# Patient Record
Sex: Male | Born: 1985 | Race: Black or African American | Hispanic: No | Marital: Single | Smoking: Current every day smoker
Health system: Southern US, Community
[De-identification: ages and names within clinical notes are randomized; demographics above are authoritative.]

## PROBLEM LIST (undated history)

## (undated) DIAGNOSIS — J45909 Unspecified asthma, uncomplicated: Secondary | ICD-10-CM

---
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
## 2007-10-05 ENCOUNTER — Emergency Department (HOSPITAL_COMMUNITY): Admission: EM | Admit: 2007-10-05 | Discharge: 2007-10-05 | Payer: Self-pay | Admitting: Emergency Medicine

## 2008-11-16 ENCOUNTER — Emergency Department (HOSPITAL_COMMUNITY): Admission: EM | Admit: 2008-11-16 | Discharge: 2008-11-16 | Payer: Self-pay | Admitting: Emergency Medicine

## 2009-04-09 ENCOUNTER — Emergency Department (HOSPITAL_COMMUNITY): Admission: EM | Admit: 2009-04-09 | Discharge: 2009-04-09 | Payer: Self-pay | Admitting: Emergency Medicine

## 2009-08-09 ENCOUNTER — Emergency Department (HOSPITAL_COMMUNITY): Admission: EM | Admit: 2009-08-09 | Discharge: 2009-08-09 | Payer: Self-pay | Admitting: Family Medicine

## 2010-04-04 LAB — POCT URINALYSIS DIP (DEVICE)
Glucose, UA: NEGATIVE mg/dL
Ketones, ur: NEGATIVE mg/dL

## 2010-04-04 LAB — GC/CHLAMYDIA PROBE AMP, GENITAL
Chlamydia, DNA Probe: NEGATIVE
GC Probe Amp, Genital: POSITIVE — AB

## 2010-04-23 LAB — URINALYSIS, ROUTINE W REFLEX MICROSCOPIC
Protein, ur: NEGATIVE mg/dL
Urobilinogen, UA: 0.2 mg/dL (ref 0.0–1.0)

## 2010-04-23 LAB — GC/CHLAMYDIA PROBE AMP, GENITAL
Chlamydia, DNA Probe: NEGATIVE
GC Probe Amp, Genital: POSITIVE — AB

## 2010-04-23 LAB — URINE MICROSCOPIC-ADD ON

## 2010-09-23 ENCOUNTER — Emergency Department (HOSPITAL_COMMUNITY)
Admission: EM | Admit: 2010-09-23 | Discharge: 2010-09-23 | Disposition: A | Discharge status: Home / Self Care | Payer: Self-pay | Attending: Emergency Medicine | Admitting: Emergency Medicine

## 2010-09-23 ENCOUNTER — Emergency Department (HOSPITAL_COMMUNITY): Payer: Self-pay

## 2010-09-23 DIAGNOSIS — S62329A Displaced fracture of shaft of unspecified metacarpal bone, initial encounter for closed fracture: Secondary | ICD-10-CM | POA: Insufficient documentation

## 2010-09-23 DIAGNOSIS — M7989 Other specified soft tissue disorders: Secondary | ICD-10-CM | POA: Insufficient documentation

## 2010-09-23 DIAGNOSIS — S6990XA Unspecified injury of unspecified wrist, hand and finger(s), initial encounter: Secondary | ICD-10-CM | POA: Insufficient documentation

## 2010-09-23 DIAGNOSIS — M79609 Pain in unspecified limb: Secondary | ICD-10-CM | POA: Insufficient documentation

## 2010-09-23 DIAGNOSIS — W2209XA Striking against other stationary object, initial encounter: Secondary | ICD-10-CM | POA: Insufficient documentation

## 2010-10-19 LAB — GC/CHLAMYDIA PROBE AMP, GENITAL: GC Probe Amp, Genital: POSITIVE — AB

## 2013-11-16 ENCOUNTER — Emergency Department (HOSPITAL_COMMUNITY)
Admission: EM | Admit: 2013-11-16 | Discharge: 2013-11-16 | Disposition: A | Discharge status: Home / Self Care | Payer: Self-pay | Attending: Emergency Medicine | Admitting: Emergency Medicine

## 2013-11-16 ENCOUNTER — Encounter (HOSPITAL_COMMUNITY): Payer: Self-pay | Admitting: Emergency Medicine

## 2013-11-16 DIAGNOSIS — Z720 Tobacco use: Secondary | ICD-10-CM | POA: Insufficient documentation

## 2013-11-16 DIAGNOSIS — R369 Urethral discharge, unspecified: Secondary | ICD-10-CM | POA: Insufficient documentation

## 2013-11-16 LAB — URINALYSIS, ROUTINE W REFLEX MICROSCOPIC
BILIRUBIN URINE: NEGATIVE
Glucose, UA: NEGATIVE mg/dL
Hgb urine dipstick: NEGATIVE
KETONES UR: 15 mg/dL — AB
NITRITE: NEGATIVE
PH: 6 (ref 5.0–8.0)
PROTEIN: 30 mg/dL — AB
Specific Gravity, Urine: 1.026 (ref 1.005–1.030)
Urobilinogen, UA: 1 mg/dL (ref 0.0–1.0)

## 2013-11-16 LAB — URINE MICROSCOPIC-ADD ON

## 2013-11-16 MED ORDER — LIDOCAINE HCL (PF) 1 % IJ SOLN
2.0000 mL | Freq: Once | INTRAMUSCULAR | Status: DC
  Filled 2013-11-16: qty 5

## 2013-11-16 MED ORDER — AZITHROMYCIN 250 MG PO TABS
1000.0000 mg | ORAL_TABLET | Freq: Once | ORAL | Status: AC
  Administered 2013-11-16: 1000 mg via ORAL
  Filled 2013-11-16: qty 4

## 2013-11-16 MED ORDER — CEFTRIAXONE SODIUM 250 MG IJ SOLR
250.0000 mg | Freq: Once | INTRAMUSCULAR | Status: AC
  Administered 2013-11-16: 250 mg via INTRAMUSCULAR
  Filled 2013-11-16: qty 250

## 2013-11-16 NOTE — ED Provider Notes (Signed)
Medical screening examination/treatment/procedure(s) were performed by non-physician practitioner and as supervising physician I was immediately available for consultation/collaboration.  Viktorya Arguijo L Dazja Houchin, MD 11/16/13 1543 

## 2013-11-16 NOTE — Discharge Instructions (Signed)

## 2013-11-16 NOTE — ED Notes (Signed)
Pt reports he has had "irritation" in penis x 2-3 days. Reports this am, he had cloudy drainage from the meatus.

## 2013-11-16 NOTE — ED Provider Notes (Signed)
CSN: [id number]     Arrival date & time 11/16/13  1131 History  This chart was scribed for non-physician practitioner, Ivar Drapeob Pearla Mckinny, PA-C,working with Flint MelterElliott L Wentz, MD, by Karle PlumberJennifer Tensley, ED Scribe. This patient was seen in room TR05C/TR05C and the patient's care was started at 11:50 AM.  No chief complaint on file.  The history is provided by the patient. No language interpreter was used.   HPI Comments:  Robert Dillon is a 28 y.o. male who presents to the Emergency Department complaining of irritation of the tip of his penis for the past 2-3 days. He reports associated cloudy discharge starting this morning. He reports he has been checked for STDs in the past. He denies dysuria, difficulty urinating, fever, chills, nausea or vomiting.  History reviewed. No pertinent past medical history. History reviewed. No pertinent past surgical history. No family history on file. History  Substance Use Topics  . Smoking status: Current Every Day Smoker  . Smokeless tobacco: Not on file  . Alcohol Use: Yes    Review of Systems  Constitutional: Negative for fever and chills.  Gastrointestinal: Negative for nausea and vomiting.  Genitourinary: Positive for penile swelling. Negative for dysuria and difficulty urinating.  Skin: Negative for color change.    Allergies  Review of patient's allergies indicates no known allergies.  Home Medications   Prior to Admission medications   Not on File   Triage Vitals: BP 137/81  Pulse 81  Temp(Src) 98.2 F (36.8 C) (Oral)  Resp 18  SpO2 99% Physical Exam  Nursing note and vitals reviewed. Constitutional: He is oriented to person, place, and time. He appears well-developed and well-nourished.  HENT:  Head: Normocephalic and atraumatic.  Eyes: EOM are normal.  Neck: Normal range of motion.  Cardiovascular: Normal rate.   Pulmonary/Chest: Effort normal.  Genitourinary: Penis normal.  Normal circumcised male. Mild, milky discharge. No  masses, lesions or other abnormalities of penis, testes, or scrotum.  Musculoskeletal: Normal range of motion.  Neurological: He is alert and oriented to person, place, and time.  Skin: Skin is warm and dry.  Psychiatric: He has a normal mood and affect. His behavior is normal.    ED Course  Procedures (including critical care time) DIAGNOSTIC STUDIES: Oxygen Saturation is 99% on RA, normal by my interpretation.   COORDINATION OF CARE: 11:54 AM- Will check and treat for GC/chlamydia. Pt verbalizes understanding and agrees to plan.  Medications  azithromycin (ZITHROMAX) tablet 1,000 mg (1,000 mg Oral Given 11/16/13 1209)  cefTRIAXone (ROCEPHIN) injection 250 mg (250 mg Intramuscular Given 11/16/13 1211)    Labs Review Labs Reviewed  URINALYSIS, ROUTINE W REFLEX MICROSCOPIC - Abnormal; Notable for the following:    APPearance CLOUDY (*)    Ketones, ur 15 (*)    Protein, ur 30 (*)    Leukocytes, UA SMALL (*)    All other components within normal limits  GC/CHLAMYDIA PROBE AMP  URINE MICROSCOPIC-ADD ON    Imaging Review No results found.   EKG Interpretation None      MDM   Final diagnoses:  Penile discharge    Patient with penile discharge.  Will check urine GC and UA.  Will treat with ceftriaxone and azithromycin.  I personally performed the services described in this documentation, which was scribed in my presence. The recorded information has been reviewed and is accurate.    Roxy Horsemanobert Josalin Carneiro, PA-C 11/16/13 1249

## 2013-11-17 LAB — GC/CHLAMYDIA PROBE AMP
CT Probe RNA: POSITIVE — AB
GC Probe RNA: POSITIVE — AB

## 2013-11-18 ENCOUNTER — Telehealth (HOSPITAL_COMMUNITY): Payer: Self-pay

## 2013-11-18 NOTE — Telephone Encounter (Signed)
spoke with pt. verified ID. informed of lab results. treated per potocol. advised to abstain from sexual activity x 10 days and to notify partner(s)

## 2013-12-16 ENCOUNTER — Ambulatory Visit (HOSPITAL_COMMUNITY)
Admission: EM | Admit: 2013-12-16 | Discharge: 2013-12-16 | Disposition: A | Discharge status: Home / Self Care | Payer: Self-pay | Attending: Emergency Medicine | Admitting: Emergency Medicine

## 2013-12-16 ENCOUNTER — Emergency Department (HOSPITAL_COMMUNITY): Payer: Self-pay | Admitting: Anesthesiology

## 2013-12-16 ENCOUNTER — Emergency Department (HOSPITAL_COMMUNITY): Payer: Self-pay

## 2013-12-16 ENCOUNTER — Encounter (HOSPITAL_COMMUNITY)
Admission: EM | Disposition: A | Discharge status: Home / Self Care | Payer: Self-pay | Source: Home / Self Care | Attending: Emergency Medicine

## 2013-12-16 ENCOUNTER — Encounter (HOSPITAL_COMMUNITY): Payer: Self-pay | Admitting: Emergency Medicine

## 2013-12-16 DIAGNOSIS — S81811A Laceration without foreign body, right lower leg, initial encounter: Secondary | ICD-10-CM | POA: Insufficient documentation

## 2013-12-16 DIAGNOSIS — Y9389 Activity, other specified: Secondary | ICD-10-CM | POA: Insufficient documentation

## 2013-12-16 DIAGNOSIS — F17210 Nicotine dependence, cigarettes, uncomplicated: Secondary | ICD-10-CM | POA: Insufficient documentation

## 2013-12-16 DIAGNOSIS — T1490XA Injury, unspecified, initial encounter: Secondary | ICD-10-CM

## 2013-12-16 DIAGNOSIS — Y92038 Other place in apartment as the place of occurrence of the external cause: Secondary | ICD-10-CM | POA: Insufficient documentation

## 2013-12-16 DIAGNOSIS — J45909 Unspecified asthma, uncomplicated: Secondary | ICD-10-CM | POA: Insufficient documentation

## 2013-12-16 DIAGNOSIS — W19XXXA Unspecified fall, initial encounter: Secondary | ICD-10-CM | POA: Insufficient documentation

## 2013-12-16 HISTORY — PX: INCISION AND DRAINAGE: SHX5863

## 2013-12-16 HISTORY — DX: Unspecified asthma, uncomplicated: J45.909

## 2013-12-16 LAB — CBC WITH DIFFERENTIAL/PLATELET
Basophils Absolute: 0 10*3/uL (ref 0.0–0.1)
Basophils Relative: 0 % (ref 0–1)
Eosinophils Absolute: 0 10*3/uL (ref 0.0–0.7)
Eosinophils Relative: 0 % (ref 0–5)
HCT: 45.5 % (ref 39.0–52.0)
HEMOGLOBIN: 15.3 g/dL (ref 13.0–17.0)
LYMPHS ABS: 3.3 10*3/uL (ref 0.7–4.0)
LYMPHS PCT: 41 % (ref 12–46)
MCH: 31.2 pg (ref 26.0–34.0)
MCHC: 33.6 g/dL (ref 30.0–36.0)
MCV: 92.7 fL (ref 78.0–100.0)
Monocytes Absolute: 0.7 10*3/uL (ref 0.1–1.0)
Monocytes Relative: 8 % (ref 3–12)
NEUTROS PCT: 51 % (ref 43–77)
Neutro Abs: 4.1 10*3/uL (ref 1.7–7.7)
PLATELETS: 323 10*3/uL (ref 150–400)
RBC: 4.91 MIL/uL (ref 4.22–5.81)
RDW: 13.7 % (ref 11.5–15.5)
WBC: 8.1 10*3/uL (ref 4.0–10.5)

## 2013-12-16 LAB — BASIC METABOLIC PANEL
Anion gap: 18 — ABNORMAL HIGH (ref 5–15)
BUN: 8 mg/dL (ref 6–23)
CO2: 24 meq/L (ref 19–32)
Calcium: 9.2 mg/dL (ref 8.4–10.5)
Chloride: 101 mEq/L (ref 96–112)
Creatinine, Ser: 0.88 mg/dL (ref 0.50–1.35)
GFR calc Af Amer: >90 mL/min (ref >90)
GFR calc non Af Amer: >90 mL/min (ref >90)
GLUCOSE: 93 mg/dL (ref 70–99)
POTASSIUM: 3.8 meq/L (ref 3.7–5.3)
SODIUM: 143 meq/L (ref 137–147)

## 2013-12-16 LAB — ETHANOL: ALCOHOL ETHYL (B): 211 mg/dL — AB (ref 0–11)

## 2013-12-16 SURGERY — INCISION AND DRAINAGE
Anesthesia: General | Laterality: Right

## 2013-12-16 MED ORDER — LIDOCAINE HCL (CARDIAC) 20 MG/ML IV SOLN
INTRAVENOUS | Status: DC | PRN
  Administered 2013-12-16: 90 mg via INTRAVENOUS

## 2013-12-16 MED ORDER — HYDROMORPHONE HCL 1 MG/ML IJ SOLN
0.2500 mg | INTRAMUSCULAR | Status: DC | PRN
  Administered 2013-12-16 (×4): 0.5 mg via INTRAVENOUS

## 2013-12-16 MED ORDER — OXYCODONE HCL 5 MG/5ML PO SOLN
5.0000 mg | Freq: Once | ORAL | Status: AC | PRN
Start: 2013-12-16 — End: 2013-12-16

## 2013-12-16 MED ORDER — SUCCINYLCHOLINE CHLORIDE 20 MG/ML IJ SOLN
INTRAMUSCULAR | Status: AC
  Filled 2013-12-16: qty 1

## 2013-12-16 MED ORDER — MORPHINE SULFATE 4 MG/ML IJ SOLN: 4.0000 mg | Freq: Once | INTRAMUSCULAR | Status: DC

## 2013-12-16 MED ORDER — ONDANSETRON HCL 4 MG/2ML IJ SOLN
INTRAMUSCULAR | Status: DC | PRN
  Administered 2013-12-16: 4 mg via INTRAVENOUS

## 2013-12-16 MED ORDER — IPRATROPIUM-ALBUTEROL 20-100 MCG/ACT IN AERS
INHALATION_SPRAY | RESPIRATORY_TRACT | Status: DC | PRN
  Administered 2013-12-16: 3 via RESPIRATORY_TRACT

## 2013-12-16 MED ORDER — GLYCOPYRROLATE 0.2 MG/ML IJ SOLN
INTRAMUSCULAR | Status: AC
  Filled 2013-12-16: qty 3

## 2013-12-16 MED ORDER — MORPHINE SULFATE 2 MG/ML IJ SOLN
INTRAMUSCULAR | Status: AC
  Administered 2013-12-16: 4 mg via INTRAVENOUS
  Filled 2013-12-16: qty 2

## 2013-12-16 MED ORDER — SODIUM CHLORIDE 0.9 % IR SOLN
Status: DC | PRN
  Administered 2013-12-16: 3000 mL

## 2013-12-16 MED ORDER — FENTANYL CITRATE 0.05 MG/ML IJ SOLN
INTRAMUSCULAR | Status: DC | PRN
  Administered 2013-12-16 (×2): 50 ug via INTRAVENOUS
  Administered 2013-12-16: 100 ug via INTRAVENOUS
  Administered 2013-12-16: 50 ug via INTRAVENOUS

## 2013-12-16 MED ORDER — OXYCODONE HCL 5 MG PO TABS
ORAL_TABLET | ORAL | Status: AC
  Administered 2013-12-16: 5 mg via ORAL
  Filled 2013-12-16: qty 1

## 2013-12-16 MED ORDER — HYDROCODONE-ACETAMINOPHEN 5-325 MG PO TABS
1.0000 | ORAL_TABLET | Freq: Once | ORAL | Status: AC
  Administered 2013-12-16: 2 via ORAL

## 2013-12-16 MED ORDER — FENTANYL CITRATE 0.05 MG/ML IJ SOLN
INTRAMUSCULAR | Status: AC
  Filled 2013-12-16: qty 5

## 2013-12-16 MED ORDER — PHENYLEPHRINE HCL 10 MG/ML IJ SOLN
INTRAMUSCULAR | Status: DC | PRN
  Administered 2013-12-16 (×4): 40 ug via INTRAVENOUS

## 2013-12-16 MED ORDER — BUPIVACAINE-EPINEPHRINE (PF) 0.5% -1:200000 IJ SOLN
INTRAMUSCULAR | Status: AC
  Filled 2013-12-16: qty 30

## 2013-12-16 MED ORDER — NEOSTIGMINE METHYLSULFATE 10 MG/10ML IV SOLN
INTRAVENOUS | Status: AC
  Filled 2013-12-16: qty 1

## 2013-12-16 MED ORDER — TETANUS-DIPHTH-ACELL PERTUSSIS 5-2.5-18.5 LF-MCG/0.5 IM SUSP
0.5000 mL | Freq: Once | INTRAMUSCULAR | Status: AC
  Administered 2013-12-16: 0.5 mL via INTRAMUSCULAR

## 2013-12-16 MED ORDER — OXYCODONE HCL 5 MG PO TABS
5.0000 mg | ORAL_TABLET | Freq: Once | ORAL | Status: AC | PRN
  Administered 2013-12-16: 5 mg via ORAL

## 2013-12-16 MED ORDER — SUCCINYLCHOLINE CHLORIDE 20 MG/ML IJ SOLN
INTRAMUSCULAR | Status: DC | PRN
  Administered 2013-12-16: 100 mg via INTRAVENOUS

## 2013-12-16 MED ORDER — ROCURONIUM BROMIDE 50 MG/5ML IV SOLN
INTRAVENOUS | Status: AC
  Filled 2013-12-16: qty 1

## 2013-12-16 MED ORDER — PROPOFOL 10 MG/ML IV BOLUS
INTRAVENOUS | Status: AC
Start: 2013-12-16 — End: 2013-12-16
  Filled 2013-12-16: qty 20

## 2013-12-16 MED ORDER — ARTIFICIAL TEARS OP OINT
TOPICAL_OINTMENT | OPHTHALMIC | Status: DC | PRN
Start: 2013-12-16 — End: 2013-12-16
  Administered 2013-12-16: 1 via OPHTHALMIC

## 2013-12-16 MED ORDER — CEPHALEXIN 500 MG PO CAPS: 500.0000 mg | ORAL_CAPSULE | Freq: Four times a day (QID) | ORAL | Status: DC

## 2013-12-16 MED ORDER — MIDAZOLAM HCL 5 MG/5ML IJ SOLN
INTRAMUSCULAR | Status: DC | PRN
  Administered 2013-12-16: 2 mg via INTRAVENOUS

## 2013-12-16 MED ORDER — LIDOCAINE HCL (CARDIAC) 20 MG/ML IV SOLN
INTRAVENOUS | Status: AC
  Filled 2013-12-16: qty 5

## 2013-12-16 MED ORDER — DEXAMETHASONE SODIUM PHOSPHATE 4 MG/ML IJ SOLN
INTRAMUSCULAR | Status: DC | PRN
  Administered 2013-12-16: 8 mg via INTRAVENOUS

## 2013-12-16 MED ORDER — MIDAZOLAM HCL 2 MG/2ML IJ SOLN
INTRAMUSCULAR | Status: AC
Start: 2013-12-16 — End: 2013-12-16
  Filled 2013-12-16: qty 2

## 2013-12-16 MED ORDER — HYDROMORPHONE HCL 1 MG/ML IJ SOLN
INTRAMUSCULAR | Status: AC
  Administered 2013-12-16: 0.5 mg via INTRAVENOUS
  Filled 2013-12-16: qty 1

## 2013-12-16 MED ORDER — CEFAZOLIN SODIUM 1-5 GM-% IV SOLN
INTRAVENOUS | Status: DC | PRN
  Administered 2013-12-16: 1 g via INTRAVENOUS

## 2013-12-16 MED ORDER — PROPOFOL 10 MG/ML IV BOLUS
INTRAVENOUS | Status: DC | PRN
  Administered 2013-12-16: 150 mg via INTRAVENOUS

## 2013-12-16 MED ORDER — CEFAZOLIN SODIUM 1-5 GM-% IV SOLN
1.0000 g | Freq: Once | INTRAVENOUS | Status: AC
  Administered 2013-12-16: 1 g via INTRAVENOUS
  Filled 2013-12-16: qty 50

## 2013-12-16 MED ORDER — PROMETHAZINE HCL 25 MG/ML IJ SOLN: 6.2500 mg | INTRAMUSCULAR | Status: DC | PRN

## 2013-12-16 MED ORDER — HYDROCODONE-ACETAMINOPHEN 5-325 MG PO TABS: 1.0000 | ORAL_TABLET | Freq: Four times a day (QID) | ORAL | Status: DC | PRN

## 2013-12-16 MED ORDER — LACTATED RINGERS IV SOLN
INTRAVENOUS | Status: DC | PRN
  Administered 2013-12-16 (×3): via INTRAVENOUS

## 2013-12-16 MED ORDER — HYDROCODONE-ACETAMINOPHEN 5-325 MG PO TABS
ORAL_TABLET | ORAL | Status: AC
  Administered 2013-12-16: 2 via ORAL
  Filled 2013-12-16: qty 2

## 2013-12-16 MED ORDER — BUPIVACAINE HCL (PF) 0.5 % IJ SOLN
INTRAMUSCULAR | Status: DC | PRN
  Administered 2013-12-16: 20 mL

## 2013-12-16 MED ORDER — ONDANSETRON HCL 4 MG/2ML IJ SOLN
INTRAMUSCULAR | Status: AC
  Filled 2013-12-16: qty 2

## 2013-12-16 MED ORDER — TETANUS-DIPHTH-ACELL PERTUSSIS 5-2.5-18.5 LF-MCG/0.5 IM SUSP
INTRAMUSCULAR | Status: AC
  Filled 2013-12-16: qty 0.5

## 2013-12-16 MED ORDER — NICOTINE 21 MG/24HR TD PT24
21.0000 mg | MEDICATED_PATCH | Freq: Once | TRANSDERMAL | Status: DC
  Administered 2013-12-16: 21 mg via TRANSDERMAL
  Filled 2013-12-16: qty 1

## 2013-12-16 MED ORDER — MORPHINE SULFATE 4 MG/ML IJ SOLN
4.0000 mg | Freq: Once | INTRAMUSCULAR | Status: AC
  Administered 2013-12-16: 4 mg via INTRAVENOUS
  Filled 2013-12-16: qty 1

## 2013-12-16 MED ORDER — PROPOFOL 10 MG/ML IV BOLUS
INTRAVENOUS | Status: AC
  Filled 2013-12-16: qty 20

## 2013-12-16 SURGICAL SUPPLY — 56 items
BANDAGE ELASTIC 4 VELCRO ST LF (GAUZE/BANDAGES/DRESSINGS) ×3 IMPLANT
BANDAGE ELASTIC 6 VELCRO ST LF (GAUZE/BANDAGES/DRESSINGS) ×3 IMPLANT
BANDAGE ESMARK 6X9 LF (GAUZE/BANDAGES/DRESSINGS) IMPLANT
BLADE SURG 10 STRL SS (BLADE) IMPLANT
BNDG COHESIVE 4X5 TAN STRL (GAUZE/BANDAGES/DRESSINGS) IMPLANT
BNDG COHESIVE 6X5 TAN STRL LF (GAUZE/BANDAGES/DRESSINGS) IMPLANT
BNDG CONFORM 3 STRL LF (GAUZE/BANDAGES/DRESSINGS) IMPLANT
BNDG ESMARK 6X9 LF (GAUZE/BANDAGES/DRESSINGS)
CANISTER SUCT 3000ML (MISCELLANEOUS) IMPLANT
CHLORAPREP W/TINT 26ML (MISCELLANEOUS) IMPLANT
COVER SURGICAL LIGHT HANDLE (MISCELLANEOUS) ×3 IMPLANT
CUFF TOURNIQUET SINGLE 34IN LL (TOURNIQUET CUFF) IMPLANT
CUFF TOURNIQUET SINGLE 44IN (TOURNIQUET CUFF) IMPLANT
DRAIN CHANNEL 10M FLAT 3/4 FLT (DRAIN) IMPLANT
DRAIN PENROSE 1/2X12 LTX STRL (WOUND CARE) IMPLANT
DRAPE U-SHAPE 47X51 STRL (DRAPES) ×3 IMPLANT
DRSG ADAPTIC 3X8 NADH LF (GAUZE/BANDAGES/DRESSINGS) IMPLANT
DRSG MEPITEL 4X7.2 (GAUZE/BANDAGES/DRESSINGS) ×3 IMPLANT
DRSG PAD ABDOMINAL 8X10 ST (GAUZE/BANDAGES/DRESSINGS) ×3 IMPLANT
ELECT REM PT RETURN 9FT ADLT (ELECTROSURGICAL) ×3
ELECTRODE REM PT RTRN 9FT ADLT (ELECTROSURGICAL) ×1 IMPLANT
EVACUATOR SILICONE 100CC (DRAIN) IMPLANT
FLUID NSS /IRRIG 3000 ML XXX (IV SOLUTION) ×3 IMPLANT
GAUZE SPONGE 4X4 12PLY STRL (GAUZE/BANDAGES/DRESSINGS) ×3 IMPLANT
GAUZE SPONGE 4X4 16PLY XRAY LF (GAUZE/BANDAGES/DRESSINGS) ×3 IMPLANT
GLOVE BIO SURGEON STRL SZ7 (GLOVE) ×3 IMPLANT
GLOVE BIO SURGEON STRL SZ8 (GLOVE) ×3 IMPLANT
GLOVE BIOGEL PI IND STRL 8 (GLOVE) ×2 IMPLANT
GLOVE BIOGEL PI INDICATOR 8 (GLOVE) ×4
GOWN STRL REUS W/ TWL XL LVL3 (GOWN DISPOSABLE) ×1 IMPLANT
GOWN STRL REUS W/TWL XL LVL3 (GOWN DISPOSABLE) ×2
KIT BASIN OR (CUSTOM PROCEDURE TRAY) ×3 IMPLANT
KIT ROOM TURNOVER OR (KITS) ×3 IMPLANT
NS IRRIG 1000ML POUR BTL (IV SOLUTION) ×3 IMPLANT
PACK ORTHO EXTREMITY (CUSTOM PROCEDURE TRAY) ×3 IMPLANT
PAD ARMBOARD 7.5X6 YLW CONV (MISCELLANEOUS) ×3 IMPLANT
PAD CAST 4YDX4 CTTN HI CHSV (CAST SUPPLIES) ×1 IMPLANT
PADDING CAST COTTON 4X4 STRL (CAST SUPPLIES) ×2
SET CYSTO W/LG BORE CLAMP LF (SET/KITS/TRAYS/PACK) IMPLANT
SOAP 2 % CHG 4 OZ (WOUND CARE) IMPLANT
SPONGE LAP 4X18 X RAY DECT (DISPOSABLE) ×3 IMPLANT
STAPLER VISISTAT 35W (STAPLE) IMPLANT
SUCTION FRAZIER TIP 10 FR DISP (SUCTIONS) IMPLANT
SUT ETHILON 3 0 FSL (SUTURE) IMPLANT
SUT PROLENE 3 0 PS 2 (SUTURE) IMPLANT
SUT VIC AB 2-0 CT1 27 (SUTURE)
SUT VIC AB 2-0 CT1 TAPERPNT 27 (SUTURE) IMPLANT
SUT VIC AB 3-0 PS2 18 (SUTURE)
SUT VIC AB 3-0 PS2 18XBRD (SUTURE) IMPLANT
TOWEL OR 17X24 6PK STRL BLUE (TOWEL DISPOSABLE) ×3 IMPLANT
TOWEL OR 17X26 10 PK STRL BLUE (TOWEL DISPOSABLE) ×3 IMPLANT
TUBE CONNECTING 12'X1/4 (SUCTIONS) ×1
TUBE CONNECTING 12X1/4 (SUCTIONS) ×2 IMPLANT
TUBING CYSTO DISP (UROLOGICAL SUPPLIES) ×3 IMPLANT
WATER STERILE IRR 1000ML POUR (IV SOLUTION) IMPLANT
YANKAUER SUCT BULB TIP NO VENT (SUCTIONS) ×3 IMPLANT

## 2013-12-16 NOTE — Progress Notes (Signed)
Pt's girlfriend successfully contacted and directed to the hospital. Discharge instructions covered & all questions answered.

## 2013-12-16 NOTE — Op Note (Signed)
NAMMarius Dillon:  Treese, [place]veer            ACCOUNT NO.:  [id number]  MEDICAL RECORD NO.:  [id number]  LOCATION:  [facility]                         FACILITY:  [facility]  PHYSICIAN:  Toni ArthursJohn Elonna Mcfarlane, MD        DATE OF BIRTH:  [birth year]  DATE OF PROCEDURE:  12/16/2013 DATE OF DISCHARGE:                              OPERATIVE REPORT   POSTOPERATIVE DIAGNOSIS:  Right anteromedial leg laceration, 20 cm in length.  POSTOPERATIVE DIAGNOSIS:  Right anteromedial leg laceration, 20 cm in length including skin, subcutaneous tissue, and bone.  PROCEDURE: 1. Irrigation and excisional debridement of right leg laceration     measuring 20 cm in length including skin, subcutaneous tissue, and     bone. 2. Complex closure of right leg laceration, 20 cm in length.  SURGEON:  Toni ArthursJohn Shai Rasmussen, M.D.  ANESTHESIA:  General.  ESTIMATED BLOOD LOSS:  Minimal.  TOURNIQUET TIME:  Zero.  COMPLICATIONS:  None apparent.  DISPOSITION:  Extubated, awake, and stable to recovery.  INDICATIONS FOR PROCEDURE:  The patient is a 28 year old male with past medical history significant for smoking.  He lacerated his leg early this morning when trying to climb up the outside of his apartment building.  He denies any history of injury or surgery in this leg in the past.  He denies numbness, tingling, or weakness.  He understands the risks and benefits, the alternative treatment options, and elects surgical treatment.  He specifically understands risks of bleeding, infection, nerve damage, blood clots, need for additional surgery, continued pain, amputation, and death.  PROCEDURE IN DETAIL:  After preoperative consent was obtained and the correct operative site was identified, the patient was brought to the operating room and placed supine on the operating table.  General anesthesia was induced.  Preoperative antibiotics were administered. Surgical time-out was taken.  Right lower extremity was prepped and draped in standard sterile  fashion with a tourniquet around the thigh. The extremity was carefully examined.  The laceration measured 20 cm in length and was slightly undermined at the proximal end.  A scalpel was then used to remove the devitalized skin edges circumferentially around the wound.  Sharp dissection was then used to debride all nonviable necrotic-appearing material within the subcutaneous tissue.  The laceration extended down to the level of the tibia, and the periosteum over the anteromedial tibia was lacerated with exposed bone.  The lacerated periosteum was excised.  The posterior compartment fascia was noted to be intact.  There was no evidence of injury to the saphenous vein or nerve.  The anterior compartment fascia was also noted to be intact.  The sartorius fascia was noted to be intact as was the bacillus semimembranosus and semitendinosus.  Once the excisional debridement was carried out with scissors and scalpel, the wound was irrigated copiously with 3 L of normal saline.  Excisional debridement was then repeated for the skin, subcutaneous tissue, periosteum, and bone.  At this point, the wound appeared generally healthy.  Near-far, far-near retention sutures of 2-0 nylon were used to approximate the skin edges dividing the wound in the quarters.  Subcutaneous tissue was then approximated along the full length of the wound with inverted simple sutures of 3-0 Monocryl.  Horizontal mattress sutures of 2-0 nylon were used then to close the skin edges.  Sterile dressings were applied followed by a compression wrap.  Prior to applying the dressings, 0.5% Marcaine with epinephrine was infiltrated into the subcutaneous tissue circumferentially around the wound for postoperative pain control.  A compression dressing was then applied from the toes to above the knee.  The patient was then awakened from anesthesia and transported to the recovery room in stable condition.  FOLLOWUP PLAN:  The patient  will be weightbearing as tolerated on his right lower extremity with crutches.  He will follow up with me in a week for wound check.  He will be discharged on hydrocodone/acetaminophen 5/325 for pain control and Keflex for antibiotic prophylaxis.     Toni ArthursJohn Javin Nong, MD     JH/MEDQ  D:  12/16/2013  T:  12/16/2013  Job:  [id number]

## 2013-12-16 NOTE — Anesthesia Preprocedure Evaluation (Addendum)
Anesthesia Evaluation  Patient identified by MRN, date of birth, ID band Patient awake    Reviewed: Allergy & Precautions, H&P , NPO status , Patient's Chart, lab work & pertinent test results  Airway Mallampati: II  TM Distance: >3 FB Neck ROM: full    Dental  (+) Teeth Intact, Dental Advidsory Given   Pulmonary asthma , Current Smoker,    Pulmonary exam normal       Cardiovascular negative cardio ROS      Neuro/Psych negative neurological ROS  negative psych ROS   GI/Hepatic negative GI ROS, Neg liver ROS,   Endo/Other  negative endocrine ROS  Renal/GU negative Renal ROS     Musculoskeletal   Abdominal Normal abdominal exam  (+)   Peds  Hematology   Anesthesia Other Findings   Reproductive/Obstetrics negative OB ROS                            Anesthesia Physical Anesthesia Plan  ASA: II and emergent  Anesthesia Plan: General, General ETT, Rapid Sequence and Cricoid Pressure   Post-op Pain Management:    Induction: Intravenous, Cricoid pressure planned and Rapid sequence  Airway Management Planned: Oral ETT  Additional Equipment:   Intra-op Plan:   Post-operative Plan:   Informed Consent: I have reviewed the patients History and Physical, chart, labs and discussed the procedure including the risks, benefits and alternatives for the proposed anesthesia with the patient or authorized representative who has indicated his/her understanding and acceptance.   Dental Advisory Given  Plan Discussed with: CRNA, Anesthesiologist and Surgeon  Anesthesia Plan Comments:        Anesthesia Quick Evaluation

## 2013-12-16 NOTE — ED Notes (Signed)
Dr Hewitt at bedside.  

## 2013-12-16 NOTE — Transfer of Care (Signed)
Immediate Anesthesia Transfer of Care Note  Patient: Robert Dillon  Procedure(s) Performed: Procedure(s): INCISION AND DRAINAGE AND CLOSURE OF RIGHT LEG LACERATION (Right)  Patient Location: PACU  Anesthesia Type:General  Level of Consciousness: sedated  Airway & Oxygen Therapy: Patient Spontanous Breathing and Patient connected to nasal cannula oxygen  Post-op Assessment: Report given to PACU RN, Post -op Vital signs reviewed and stable and Patient moving all extremities  Post vital signs: Reviewed and stable  Complications: No apparent anesthesia complications

## 2013-12-16 NOTE — Progress Notes (Signed)
Orthopedic Tech Progress Note Patient Details:  Robert Dillon 27-Aug-1985 [id number]  Ortho Devices Type of Ortho Device: Crutches Ortho Device/Splint Interventions: Application   Robert Dillon 12/16/2013, 11:59 AM

## 2013-12-16 NOTE — H&P (Signed)
Robert Dillon is an 28 y.o. male.   Chief Complaint: right leg laceration HPI:  28 y/o male with PMH of smoking c/o an injury to his right leg early this morning.  The patient says he was trying to scale the outside of his apartment building when he fell cutting his leg open.  He denies any injury or surgery to the leg in the past.  He last ate at 9 pm last night and last drank a shot of alcohol at 0215 this morning.  He c/o severe pain in the right leg that is worse with any motion and feels better with rest and elevation.  He denies numbness, tingling or weakness in the foot or leg below the laceration.  Past Medical History  Diagnosis Date  . Asthma   PSH:  none  FH:  Diabetes, htn.   Social History:  reports that he has been smoking Cigarettes.  He has been smoking about 0.00 packs per day. He does not have any smokeless tobacco history on file. He reports that he drinks alcohol. He reports that he uses illicit drugs (Marijuana).  He smokes less than a half pack per day of cigarettes.  He works in a [employer].  Allergies:  Allergies  Allergen Reactions  . Penicillins      (Not in a hospital admission)  Results for orders placed or performed during the hospital encounter of 12/16/13 (from the past 48 hour(s))  CBC with Differential     Status: None   Collection Time: 12/16/13  4:16 AM  Result Value Ref Range   WBC 8.1 4.0 - 10.5 K/uL   RBC 4.91 4.22 - 5.81 MIL/uL   Hemoglobin 15.3 13.0 - 17.0 g/dL   HCT 45.5 39.0 - 52.0 %   MCV 92.7 78.0 - 100.0 fL   MCH 31.2 26.0 - 34.0 pg   MCHC 33.6 30.0 - 36.0 g/dL   RDW 13.7 11.5 - 15.5 %   Platelets 323 150 - 400 K/uL   Neutrophils Relative % 51 43 - 77 %   Neutro Abs 4.1 1.7 - 7.7 K/uL   Lymphocytes Relative 41 12 - 46 %   Lymphs Abs 3.3 0.7 - 4.0 K/uL   Monocytes Relative 8 3 - 12 %   Monocytes Absolute 0.7 0.1 - 1.0 K/uL   Eosinophils Relative 0 0 - 5 %   Eosinophils Absolute 0.0 0.0 - 0.7 K/uL   Basophils Relative 0 0 - 1  %   Basophils Absolute 0.0 0.0 - 0.1 K/uL  Basic metabolic panel     Status: Abnormal   Collection Time: 12/16/13  4:16 AM  Result Value Ref Range   Sodium 143 137 - 147 mEq/L   Potassium 3.8 3.7 - 5.3 mEq/L   Chloride 101 96 - 112 mEq/L   CO2 24 19 - 32 mEq/L   Glucose, Bld 93 70 - 99 mg/dL   BUN 8 6 - 23 mg/dL   Creatinine, Ser 0.88 0.50 - 1.35 mg/dL   Calcium 9.2 8.4 - 10.5 mg/dL   GFR calc non Af Amer >90 >90 mL/min   GFR calc Af Amer >90 >90 mL/min    Comment: (NOTE) The eGFR has been calculated using the CKD EPI equation. This calculation has not been validated in all clinical situations. eGFR's persistently <90 mL/min signify possible Chronic Kidney Disease.    Anion gap 18 (H) 5 - 15  Ethanol     Status: Abnormal   Collection Time: 12/16/13  4:16 AM  Result Value Ref Range   Alcohol, Ethyl (B) 211 (H) 0 - 11 mg/dL    Comment:        LOWEST DETECTABLE LIMIT FOR SERUM ALCOHOL IS 11 mg/dL FOR MEDICAL PURPOSES ONLY    Dg Tibia/fibula Right  28-Dec-2013   CLINICAL DATA:  Large laceration at the right anterior medial lower leg, after falling off roof. Laceration extends to the bone. Initial encounter.  EXAM: RIGHT TIBIA AND FIBULA - 2 VIEW  COMPARISON:  None.  FINDINGS: A large soft tissue laceration is noted at the medial aspect of the right lower leg. There is no evidence of osseous disruption. No radiopaque foreign bodies are seen.  The knee joint is incompletely assessed but appears grossly unremarkable. The ankle mortise is unremarkable in appearance.  IMPRESSION: No radiopaque foreign bodies seen. No evidence of osseous disruption.   Electronically Signed   By: Garald Balding M.D.   On: 12-28-2013 05:01   Dg Foot 2 Views Right  12-28-2013   CLINICAL DATA:  Status post fall off roof, with a large laceration at the anterior medial aspect of the right lower leg. Assess for foot injury. Initial encounter.  EXAM: RIGHT FOOT - 2 VIEW  COMPARISON:  None.  FINDINGS: There is no  evidence of fracture or dislocation. The joint spaces are preserved. There is no evidence of talar subluxation; the subtalar joint is unremarkable in appearance. An os trigonum is noted.  No significant soft tissue abnormalities are seen.  IMPRESSION: 1. No evidence of fracture or dislocation. 2. Os trigonum noted.   Electronically Signed   By: Garald Balding M.D.   On: 12/28/2013 05:02    ROS  No recent f/c/n/v/wt loss.  Blood pressure 131/81, pulse 98, temperature 99 F (37.2 C), temperature source Oral, resp. rate 16, height _0  (1.727 m), weight 83.915 kg (185 lb), SpO2 99 %. Physical Exam  wn wd male in nad.  A and O x 4.  Mood and affect normal.  EOMI.  resp unlabored.  Gait is NWB on he right.  R LE with healthy and intact skin and no lymphadenopathy.  R leg with anteromedial laceration about 20 cm long.  Subcut tissue, fascia and bone are evident.  Sens to LT intact in the saphenous n dist.  5/5 strength in PF, DF, inversion and eversion about the ankle.  Sens to LT intact at the medial and lateral plantar n dist.  Brisk cap refill at the toes.    Assessment/Plan R leg laceration without apparent involvement of the adjacent NV, muscle or tendon structures - to OR for I and D and closure of the wound.  Tetanus updated by the ED MD.  Pt has been NPO for 6 hours.  The risks and benefits of the alternative treatment options have been discussed in detail.  The patient wishes to proceed with surgery and specifically understands risks of bleeding, infection, nerve damage, blood clots, need for additional surgery, continued pain, amputation and death.   [name] 28-Dec-2013, 8:20 AM

## 2013-12-16 NOTE — Anesthesia Postprocedure Evaluation (Signed)
Anesthesia Post Note  Patient: Robert Dillon  Procedure(s) Performed: Procedure(s) (LRB): INCISION AND DRAINAGE AND CLOSURE OF RIGHT LEG LACERATION (Right)  Anesthesia type: general  Patient location: PACU  Post pain: Pain level controlled  Post assessment: Patient's Cardiovascular Status Stable  Last Vitals:  Filed Vitals:   12/16/13 1200  BP:   Pulse: 95  Temp:   Resp: 18    Post vital signs: Reviewed and stable  Level of consciousness: sedated  Complications: No apparent anesthesia complications

## 2013-12-16 NOTE — ED Notes (Signed)
Consent for surgery signed, pt to be transported to OR.

## 2013-12-16 NOTE — ED Notes (Addendum)
Pt resting quietly at the time. R lower leg wrapped with wet to dry dressing, pedal pulses present, able to wiggle digits, bleeding controlled. Girlfriend at bedside. Pt is alert and oriented x4.

## 2013-12-16 NOTE — Progress Notes (Signed)
Chaplain encountered pt and family in trauma b. Chaplain directed family to trauma bay and offered hospitality. No further services needed at this time.   12/16/13 0400  Clinical Encounter Type  Visited With Patient and family together  Visit Type Initial  Laela Deviney, Mayer MaskerCourtney F, Chaplain 12/16/2013 4:50 AM

## 2013-12-16 NOTE — ED Notes (Signed)
[place]

## 2013-12-16 NOTE — ED Notes (Signed)
Patient stated that he was trying to get into his apartment, tried "climbing" the outside of the house, slid and fell about 5 feet.  Patient states that he does not know what he cut his leg on, patient has a 1 foot long open laceration, faschia, bone, muscle showing.  Bleeding controlled.

## 2013-12-16 NOTE — Progress Notes (Signed)
Orthopedic Tech Progress Note Patient Details:  Robert Dillon 20-May-1985 [id number]  Patient ID: Robert Dillon, male   DOB: 20-May-1985, 28 y.o.   MRN: [id number] Viewed order from doctor's order list  Nikki DomCrawford, Triston Skare 12/16/2013, 12:00 PM

## 2013-12-16 NOTE — Anesthesia Procedure Notes (Signed)
Procedure Name: Intubation Date/Time: 12/16/2013 9:24 AM Performed by: Edmonia CaprioAUSTON, Shion Bluestein M Pre-anesthesia Checklist: Patient identified, Timeout performed, Suction available, Patient being monitored and Emergency Drugs available Patient Re-evaluated:Patient Re-evaluated prior to inductionOxygen Delivery Method: Circle system utilized Preoxygenation: Pre-oxygenation with 100% oxygen Intubation Type: Cricoid Pressure applied, Rapid sequence and IV induction Laryngoscope Size: Miller and 2 Grade View: Grade I Tube type: Oral Tube size: 7.5 mm Number of attempts: 1 Placement Confirmation: ETT inserted through vocal cords under direct vision,  positive ETCO2 and breath sounds checked- equal and bilateral Secured at: 21 cm Tube secured with: Tape Dental Injury: Teeth and Oropharynx as per pre-operative assessment

## 2013-12-16 NOTE — Progress Notes (Signed)
Left a voicemail for pt's girlfriend after making multiple phone calls with no answer. Pt does not have anyone else who can come get him.  VSS and pain is well controlled. Taking po's w/o difficulty. Moved to phase II - sleeping comfortably while waiting for someone to take him home.

## 2013-12-16 NOTE — Discharge Instructions (Signed)
Robert ArthursJohn Tayten Bergdoll, MD [facility]  Please read the following information regarding your care after surgery.  Medications  You only need a prescription for the narcotic pain medicine (ex. oxycodone, Percocet, Norco).  All of the other medicines listed below are available over the counter. X Norco as prescribed for severe pain X ibuprofen (Mortrin, Advil) 800 mg every 8 hours as needed for pain  Narcotic pain medicine (ex. oxycodone, Percocet, Vicodin) will cause constipation.  To prevent this problem, take the following medicines while you are taking any pain medicine. X docusate sodium (Colace) 100 mg twice a day  X senna (Senokot) 2 tablets twice a day  Weight Bearing X Bear weight when you are able on your operated leg or foot.  Cast / Splint / Dressing X Keep your dressing clean and dry.  Dont put anything (coat hanger, pencil, etc) down inside of it.  If it gets damp, use a hair dryer on the cool setting to dry it.  If it gets soaked, call the office to schedule an appointment for a cast change.  After your dressing, cast or splint is removed; you may shower, but do not soak or scrub the wound.  Allow the water to run over it, and then gently pat it dry.  Swelling It is normal for you to have swelling where you had surgery.  To reduce swelling and pain, keep your toes above your nose for at least 3 days after surgery.  It may be necessary to keep your foot or leg elevated for several weeks.  If it hurts, it should be elevated.  Follow Up Call my office at [phone] when you are discharged from the hospital or surgery center to schedule an appointment to be seen two weeks after surgery.  Call my office at [phone] if you develop a fever >101.5 F, nausea, vomiting, bleeding from the surgical site or severe pain.

## 2013-12-16 NOTE — Brief Op Note (Signed)
12/16/2013  10:22 AM  PATIENT:  Robert Dillon  28 y.o. male  PRE-OPERATIVE DIAGNOSIS:  Right leg laceration 20 cm   POST-OPERATIVE DIAGNOSIS:  Same, including skin, subcutaneous tissue and bone  Procedure(s):  1.  Irrigation and excisional debridement of right leg wound 20 cm long including skin, subcutaneous tissue and bone   2.  Complex closure of right leg wound 20 cm in length  SURGEON:  Toni ArthursJohn Aashrith Eves, MD  ASSISTANT: n/a  ANESTHESIA:   General  EBL:  minimal   TOURNIQUET:  none  COMPLICATIONS:  None apparent  DISPOSITION:  Extubated, awake and stable to recovery.  DICTATION ID:  [id number]

## 2013-12-17 ENCOUNTER — Encounter (HOSPITAL_COMMUNITY): Payer: Self-pay | Admitting: Orthopedic Surgery
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
## 2014-12-04 ENCOUNTER — Emergency Department (HOSPITAL_COMMUNITY)
Admission: EM | Admit: 2014-12-04 | Discharge: 2014-12-04 | Disposition: A | Discharge status: Home / Self Care | Payer: Self-pay | Attending: Emergency Medicine | Admitting: Emergency Medicine

## 2014-12-04 ENCOUNTER — Encounter (HOSPITAL_COMMUNITY): Payer: Self-pay | Admitting: Emergency Medicine

## 2014-12-04 DIAGNOSIS — Z880 Allergy status to penicillin: Secondary | ICD-10-CM | POA: Insufficient documentation

## 2014-12-04 DIAGNOSIS — Z792 Long term (current) use of antibiotics: Secondary | ICD-10-CM | POA: Insufficient documentation

## 2014-12-04 DIAGNOSIS — A64 Unspecified sexually transmitted disease: Secondary | ICD-10-CM | POA: Insufficient documentation

## 2014-12-04 DIAGNOSIS — J45909 Unspecified asthma, uncomplicated: Secondary | ICD-10-CM | POA: Insufficient documentation

## 2014-12-04 DIAGNOSIS — F17210 Nicotine dependence, cigarettes, uncomplicated: Secondary | ICD-10-CM | POA: Insufficient documentation

## 2014-12-04 MED ORDER — AZITHROMYCIN 1 G PO PACK
1.0000 g | PACK | Freq: Once | ORAL | Status: AC
  Administered 2014-12-04: 1 g via ORAL
  Filled 2014-12-04: qty 1

## 2014-12-04 MED ORDER — CEFTRIAXONE SODIUM 250 MG IJ SOLR
250.0000 mg | Freq: Once | INTRAMUSCULAR | Status: AC
  Administered 2014-12-04: 250 mg via INTRAMUSCULAR
  Filled 2014-12-04: qty 250

## 2014-12-04 NOTE — Discharge Instructions (Signed)
Sexually Transmitted Disease °A sexually transmitted disease (STD) is a disease or infection that may be passed (transmitted) from person to person, usually during sexual activity. This may happen by way of saliva, semen, blood, vaginal mucus, or urine. Common STDs include: °· Gonorrhea. °· Chlamydia. °· Syphilis. °· HIV and AIDS. °· Genital herpes. °· Hepatitis B and C. °· Trichomonas. °· Human papillomavirus (HPV). °· Pubic lice. °· Scabies. °· Mites. °· Bacterial vaginosis. °WHAT ARE CAUSES OF STDs? °An STD may be caused by bacteria, a virus, or parasites. STDs are often transmitted during sexual activity if one person is infected. However, they may also be transmitted through nonsexual means. STDs may be transmitted after:  °· Sexual intercourse with an infected person. °· Sharing sex toys with an infected person. °· Sharing needles with an infected person or using unclean piercing or tattoo needles. °· Having intimate contact with the genitals, mouth, or rectal areas of an infected person. °· Exposure to infected fluids during birth. °WHAT ARE THE SIGNS AND SYMPTOMS OF STDs? °Different STDs have different symptoms. Some people may not have any symptoms. If symptoms are present, they may include: °· Painful or bloody urination. °· Pain in the pelvis, abdomen, vagina, anus, throat, or eyes. °· A skin rash, itching, or irritation. °· Growths, ulcerations, blisters, or sores in the genital and anal areas. °· Abnormal vaginal discharge with or without bad odor. °· Penile discharge in men. °· Fever. °· Pain or bleeding during sexual intercourse. °· Swollen glands in the groin area. °· Yellow skin and eyes (jaundice). This is seen with hepatitis. °· Swollen testicles. °· Infertility. °· Sores and blisters in the mouth. °HOW ARE STDs DIAGNOSED? °To make a diagnosis, your health care provider may: °· Take a medical history. °· Perform a physical exam. °· Take a sample of any discharge to examine. °· Swab the throat,  cervix, opening to the penis, rectum, or vagina for testing. °· Test a sample of your first morning urine. °· Perform blood tests. °· Perform a Pap test, if this applies. °· Perform a colposcopy. °· Perform a laparoscopy. °HOW ARE STDs TREATED? °Treatment depends on the STD. Some STDs may be treated but not cured. °· Chlamydia, gonorrhea, trichomonas, and syphilis can be cured with antibiotic medicine. °· Genital herpes, hepatitis, and HIV can be treated, but not cured, with prescribed medicines. The medicines lessen symptoms. °· Genital warts from HPV can be treated with medicine or by freezing, burning (electrocautery), or surgery. Warts may come back. °· HPV cannot be cured with medicine or surgery. However, abnormal areas may be removed from the cervix, vagina, or vulva. °· If your diagnosis is confirmed, your recent sexual partners need treatment. This is true even if they are symptom-free or have a negative culture or evaluation. They should not have sex until their health care providers say it is okay. °· Your health care provider may test you for infection again 3 months after treatment. °HOW CAN I REDUCE MY RISK OF GETTING AN STD? °Take these steps to reduce your risk of getting an STD: °· Use latex condoms, dental dams, and water-soluble lubricants during sexual activity. Do not use petroleum jelly or oils. °· Avoid having multiple sex partners. °· Do not have sex with someone who has other sex partners °· Do not have sex with anyone you do not know or who is at high risk for an STD. °· Avoid risky sex practices that can break your skin. °· Do not have sex   if you have open sores on your mouth or skin.  Avoid drinking too much alcohol or taking illegal drugs. Alcohol and drugs can affect your judgment and put you in a vulnerable position.  Avoid engaging in oral and anal sex acts.  Get vaccinated for HPV and hepatitis. If you have not received these vaccines in the past, talk to your health care  provider about whether one or both might be right for you.  If you are at risk of being infected with HIV, it is recommended that you take a prescription medicine daily to prevent HIV infection. This is called pre-exposure prophylaxis (PrEP). You are considered at risk if:  You are a man who has sex with other men (MSM).  You are a heterosexual man or woman and are sexually active with more than one partner.  You take drugs by injection.  You are sexually active with a partner who has HIV.  Talk with your health care provider about whether you are at high risk of being infected with HIV. If you choose to begin PrEP, you should first be tested for HIV. You should then be tested every 3 months for as long as you are taking PrEP. WHAT SHOULD I DO IF I THINK I HAVE AN STD?  See your health care provider.  Tell your sexual partner(s). They should be tested and treated for any STDs.  Do not have sex until your health care provider says it is okay. WHEN SHOULD I GET IMMEDIATE MEDICAL CARE? Contact your health care provider right away if:   You have severe abdominal pain.  You are a man and notice swelling or pain in your testicles.  You are a woman and notice swelling or pain in your vagina.   This information is not intended to replace advice given to you by your health care provider. Make sure you discuss any questions you have with your health care provider.   Document Released: 03/27/2002 Document Revised: 01/25/2014 Document Reviewed: 07/25/2012 Elsevier Interactive Patient Education 2016 [employer]  Please read attached information. If you experience any new or worsening signs or symptoms please return to the emergency room for evaluation. Please follow-up with your primary care provider or specialist as discussed. Please use medication prescribed only as directed and discontinue taking if you have any concerning signs or symptoms.

## 2014-12-04 NOTE — ED Provider Notes (Signed)
CSN: [id number]     Arrival date & time 12/04/14  0909 History  By signing my name below, I, Essence Howell, attest that this documentation has been prepared under the direction and in the presence of Eyvonne Mechanic, PA-C Electronically Signed: Charline Bills, ED Scribe 12/04/2014 at 9:33 AM.   Chief Complaint  Patient presents with  . SEXUALLY TRANSMITTED DISEASE   The history is provided by the patient. No language interpreter was used.   HPI Comments: Robert Dillon is a 29 y.o. male who presents to the Emergency Department complaining of persistent green, cloudy penile discharge onset 2 days ago. Pt states that his last sexual encounter was 1 week ago with a male partner who was diagnosed with a STD 2 days ago; he is unsure of which STD. Pt reports associated dysuria 3 days ago that has resolved. He denies fever, chills, abdominal pain, nausea, vomiting. Pt reports h/o STD. No recent antibiotic use.   Past Medical History  Diagnosis Date  . Asthma    Past Surgical History  Procedure Laterality Date  . Incision and drainage Right 12/16/2013    Procedure: INCISION AND DRAINAGE AND CLOSURE OF RIGHT LEG LACERATION;  Surgeon: Toni Arthurs, MD;  Location: [facility] OR;  Service: Orthopedics;  Laterality: Right;   No family history on file. Social History  Substance Use Topics  . Smoking status: Current Every Day Smoker -- 0.50 packs/day    Types: Cigarettes  . Smokeless tobacco: None  . Alcohol Use: Yes     Comment: socially    Review of Systems  Constitutional: Negative for fever and chills.  Gastrointestinal: Negative for nausea, vomiting and abdominal pain.  Genitourinary: Positive for dysuria and discharge.  All other systems reviewed and are negative.  Allergies  Penicillins  Home Medications   Prior to Admission medications   Medication Sig Start Date End Date Taking? Authorizing Provider  cephALEXin (KEFLEX) 500 MG capsule Take 1 capsule (500 mg total) by mouth 4  (four) times daily. 12/16/13   Toni Arthurs, MD  HYDROcodone-acetaminophen (NORCO) 5-325 MG per tablet Take 1-2 tablets by mouth every 6 (six) hours as needed for moderate pain. 12/16/13   Toni Arthurs, MD   BP 128/84 mmHg  Pulse 75  Temp(Src) 97.7 F (36.5 C) (Oral)  SpO2 99%  Physical Exam  Constitutional: He is oriented to person, place, and time. He appears well-developed and well-nourished. No distress.  HENT:  Head: Normocephalic and atraumatic.  Eyes: Conjunctivae and EOM are normal.  Neck: Neck supple. No tracheal deviation present.  Cardiovascular: Normal rate.   Pulmonary/Chest: Effort normal. No respiratory distress.  Genitourinary: Right testis shows no tenderness. Left testis shows no tenderness. Circumcised. Discharge (purulent) found.  No erythema.   Musculoskeletal: Normal range of motion.  Neurological: He is alert and oriented to person, place, and time.  Skin: Skin is warm and dry.  Psychiatric: He has a normal mood and affect. His behavior is normal.  Nursing note and vitals reviewed.  ED Course  Procedures (including critical care time) DIAGNOSTIC STUDIES: Oxygen Saturation is 99% on RA, normal by my interpretation.    COORDINATION OF CARE: 9:23 AM-Discussed treatment plan which includes STD screening with pt at bedside and pt agreed to plan.   Labs Review Labs Reviewed  HIV ANTIBODY (ROUTINE TESTING)  RPR  GC/CHLAMYDIA PROBE AMP ([place]) NOT AT [facility]    Imaging Review No results found. I have personally reviewed and evaluated these images and lab results as part  of my medical decision-making.   EKG Interpretation None      MDM   Final diagnoses:  STD (male)   Labs: STD screening  Imaging:  Consults:  Therapeutics: Rocephin and azithromycin   Discharge Meds:   Assessment/Plan: Pt presentation most consistent with STD. Treated here in the ED. Will have results called in. Pt given strict return precautions and verbalized  understanding and agreement to today's plan and had no other concerns at discharge.   I personally performed the services described in this documentation, which was scribed in my presence. The recorded information has been reviewed and is accurate.    Eyvonne MechanicJeffrey Einar Nolasco, PA-C 12/04/14 [id number]  Gwyneth SproutWhitney Plunkett, MD 12/06/14 [phone]

## 2014-12-04 NOTE — ED Notes (Signed)
PA has completed assessment.

## 2014-12-04 NOTE — ED Notes (Signed)
Patient states had a partner that told him they had an STD.  Patient having discharge since Monday.   Patient denies other symptoms.

## 2014-12-05 LAB — GC/CHLAMYDIA PROBE AMP (~~LOC~~) NOT AT ARMC
Chlamydia: NEGATIVE
Neisseria Gonorrhea: POSITIVE — AB

## 2014-12-05 LAB — HIV ANTIBODY (ROUTINE TESTING W REFLEX): HIV Screen 4th Generation wRfx: NONREACTIVE

## 2014-12-05 LAB — RPR: RPR Ser Ql: NONREACTIVE

## 2014-12-06 ENCOUNTER — Telehealth (HOSPITAL_BASED_OUTPATIENT_CLINIC_OR_DEPARTMENT_OTHER): Payer: Self-pay | Admitting: *Deleted

## 2014-12-07 ENCOUNTER — Telehealth (HOSPITAL_COMMUNITY): Payer: Self-pay

## 2014-12-07 NOTE — Telephone Encounter (Signed)
Spoke with pt. Verified ID. Informed of labs. Treated per protocol. [employer] form faxed. Pt informed to abstain from sexual activity x 10 days and to notify partner for testing and treatment.  

## 2017-01-14 ENCOUNTER — Emergency Department (HOSPITAL_COMMUNITY)
Admission: EM | Admit: 2017-01-14 | Discharge: 2017-01-14 | Disposition: A | Discharge status: Home / Self Care | Payer: Self-pay | Attending: Emergency Medicine | Admitting: Emergency Medicine

## 2017-01-14 ENCOUNTER — Encounter (HOSPITAL_COMMUNITY): Payer: Self-pay | Admitting: Emergency Medicine

## 2017-01-14 ENCOUNTER — Other Ambulatory Visit: Payer: Self-pay

## 2017-01-14 ENCOUNTER — Emergency Department (HOSPITAL_COMMUNITY): Payer: Self-pay

## 2017-01-14 DIAGNOSIS — E860 Dehydration: Secondary | ICD-10-CM | POA: Insufficient documentation

## 2017-01-14 DIAGNOSIS — R509 Fever, unspecified: Secondary | ICD-10-CM | POA: Insufficient documentation

## 2017-01-14 DIAGNOSIS — R51 Headache: Secondary | ICD-10-CM | POA: Insufficient documentation

## 2017-01-14 DIAGNOSIS — R05 Cough: Secondary | ICD-10-CM | POA: Insufficient documentation

## 2017-01-14 DIAGNOSIS — J45909 Unspecified asthma, uncomplicated: Secondary | ICD-10-CM | POA: Insufficient documentation

## 2017-01-14 DIAGNOSIS — R69 Illness, unspecified: Secondary | ICD-10-CM

## 2017-01-14 DIAGNOSIS — M79669 Pain in unspecified lower leg: Secondary | ICD-10-CM | POA: Insufficient documentation

## 2017-01-14 DIAGNOSIS — J111 Influenza due to unidentified influenza virus with other respiratory manifestations: Secondary | ICD-10-CM

## 2017-01-14 DIAGNOSIS — F17210 Nicotine dependence, cigarettes, uncomplicated: Secondary | ICD-10-CM | POA: Insufficient documentation

## 2017-01-14 LAB — CBC WITH DIFFERENTIAL/PLATELET
BASOS ABS: 0 10*3/uL (ref 0.0–0.1)
Basophils Relative: 0 %
EOS ABS: 0 10*3/uL (ref 0.0–0.7)
Eosinophils Relative: 0 %
HCT: 45.5 % (ref 39.0–52.0)
HEMOGLOBIN: 15.6 g/dL (ref 13.0–17.0)
LYMPHS ABS: 1.6 10*3/uL (ref 0.7–4.0)
LYMPHS PCT: 24 %
MCH: 32.4 pg (ref 26.0–34.0)
MCHC: 34.3 g/dL (ref 30.0–36.0)
MCV: 94.4 fL (ref 78.0–100.0)
MONO ABS: 1.1 10*3/uL — AB (ref 0.1–1.0)
Monocytes Relative: 16 %
NEUTROS ABS: 4 10*3/uL (ref 1.7–7.7)
Neutrophils Relative %: 60 %
Platelets: 235 10*3/uL (ref 150–400)
RBC: 4.82 MIL/uL (ref 4.22–5.81)
RDW: 13.5 % (ref 11.5–15.5)
WBC: 6.7 10*3/uL (ref 4.0–10.5)

## 2017-01-14 LAB — COMPREHENSIVE METABOLIC PANEL
ALK PHOS: 73 U/L (ref 38–126)
ALT: 32 U/L (ref 17–63)
AST: 28 U/L (ref 15–41)
Albumin: 4.3 g/dL (ref 3.5–5.0)
Anion gap: 8 (ref 5–15)
BUN: 9 mg/dL (ref 6–20)
CALCIUM: 9.1 mg/dL (ref 8.9–10.3)
CO2: 24 mmol/L (ref 22–32)
Chloride: 101 mmol/L (ref 101–111)
Creatinine, Ser: 1.08 mg/dL (ref 0.61–1.24)
GFR calc Af Amer: >60 mL/min (ref >60)
Glucose, Bld: 104 mg/dL — ABNORMAL HIGH (ref 65–99)
POTASSIUM: 3.9 mmol/L (ref 3.5–5.1)
Sodium: 133 mmol/L — ABNORMAL LOW (ref 135–145)
Total Bilirubin: 0.4 mg/dL (ref 0.3–1.2)
Total Protein: 7.7 g/dL (ref 6.5–8.1)

## 2017-01-14 LAB — I-STAT CG4 LACTIC ACID, ED
LACTIC ACID, VENOUS: 0.76 mmol/L (ref 0.5–1.9)
Lactic Acid, Venous: 0.52 mmol/L (ref 0.5–1.9)

## 2017-01-14 LAB — PROTIME-INR
INR: 0.99
PROTHROMBIN TIME: 13 s (ref 11.4–15.2)

## 2017-01-14 LAB — RAPID STREP SCREEN (MED CTR MEBANE ONLY): STREPTOCOCCUS, GROUP A SCREEN (DIRECT): NEGATIVE

## 2017-01-14 MED ORDER — IBUPROFEN 800 MG PO TABS
800.0000 mg | ORAL_TABLET | Freq: Once | ORAL | Status: AC
  Administered 2017-01-14: 800 mg via ORAL
  Filled 2017-01-14: qty 1

## 2017-01-14 MED ORDER — IBUPROFEN 600 MG PO TABS: 600.0000 mg | ORAL_TABLET | Freq: Four times a day (QID) | ORAL | 0 refills | Status: DC | PRN

## 2017-01-14 MED ORDER — KETOROLAC TROMETHAMINE 15 MG/ML IJ SOLN
15.0000 mg | Freq: Once | INTRAMUSCULAR | Status: AC
  Administered 2017-01-14: 15 mg via INTRAVENOUS
  Filled 2017-01-14: qty 1

## 2017-01-14 MED ORDER — ACETAMINOPHEN 500 MG PO TABS
1000.0000 mg | ORAL_TABLET | Freq: Once | ORAL | Status: AC
  Administered 2017-01-14: 1000 mg via ORAL
  Filled 2017-01-14: qty 2

## 2017-01-14 MED ORDER — SODIUM CHLORIDE 0.9 % IV BOLUS (SEPSIS)
1000.0000 mL | Freq: Once | INTRAVENOUS | Status: AC
  Administered 2017-01-14: 1000 mL via INTRAVENOUS

## 2017-01-14 MED ORDER — ONDANSETRON 4 MG PO TBDP: 4.0000 mg | ORAL_TABLET | Freq: Three times a day (TID) | ORAL | 0 refills | Status: DC | PRN

## 2017-01-14 MED ORDER — OSELTAMIVIR PHOSPHATE 75 MG PO CAPS: 75.0000 mg | ORAL_CAPSULE | Freq: Two times a day (BID) | ORAL | 0 refills | Status: DC

## 2017-01-14 NOTE — ED Triage Notes (Signed)
Pt. Stated, Robert Dillon had flu like symptoms with fever body aches, cold all over

## 2017-01-14 NOTE — ED Provider Notes (Signed)
[facility] Provider Note   CSN: [id number] Arrival date & time: 01/14/17  0941     History   Chief Complaint Chief Complaint  Patient presents with  . Influenza  . Fever  . Generalized Body Aches    HPI Robert Dillon is a 31 y.o. male.  HPI   31 year old male with past medical history of mild asthma here with fever, aches, and chills.  The patient states that both of his children were recently diagnosed with pneumonia as well as strep throat.  At around 5:00 yesterday afternoon he developed acute onset of chills, aches, and cough.  He has had associated mild headache when he coughs but this headache then resolves.  He has had subjective fevers.  He also reports decreased appetite and a sensation of dry mouth.  He has not had any vomiting.  Denies any diarrhea.  He has a mild scratchy throat that he feels is due to his throat being dry.  Denies any tender lymph nodes.  Denies any abdominal pain.  No urinary symptoms.  No rash.  Past Medical History:  Diagnosis Date  . Asthma     There are no active problems to display for this patient.   Past Surgical History:  Procedure Laterality Date  . INCISION AND DRAINAGE Right 12/16/2013   Procedure: INCISION AND DRAINAGE AND CLOSURE OF RIGHT LEG LACERATION;  Surgeon: Toni ArthursJohn Hewitt, MD;  Location: [facility] OR;  Service: Orthopedics;  Laterality: Right;       Home Medications    Prior to Admission medications   Medication Sig Start Date End Date Taking? Authorizing Provider  cephALEXin (KEFLEX) 500 MG capsule Take 1 capsule (500 mg total) by mouth 4 (four) times daily. 12/16/13   Toni ArthursHewitt, John, MD  HYDROcodone-acetaminophen (NORCO) 5-325 MG per tablet Take 1-2 tablets by mouth every 6 (six) hours as needed for moderate pain. 12/16/13   Toni ArthursHewitt, John, MD  ibuprofen (ADVIL,MOTRIN) 600 MG tablet Take 1 tablet (600 mg total) by mouth every 6 (six) hours as needed for moderate pain. 01/14/17    Shaune PollackIsaacs, Saray Capasso, MD  ondansetron (ZOFRAN ODT) 4 MG disintegrating tablet Take 1 tablet (4 mg total) by mouth every 8 (eight) hours as needed for nausea or vomiting. 01/14/17   Shaune PollackIsaacs, Roverto Bodmer, MD  oseltamivir (TAMIFLU) 75 MG capsule Take 1 capsule (75 mg total) by mouth every 12 (twelve) hours. 01/14/17   Shaune PollackIsaacs, Taylon Coole, MD    Family History No family history on file.  Social History Social History   Tobacco Use  . Smoking status: Current Every Day Smoker    Packs/day: 0.50    Types: Cigarettes  . Smokeless tobacco: Current User  Substance Use Topics  . Alcohol use: Yes    Comment: socially  . Drug use: Yes    Types: Marijuana     Allergies   Penicillins   Review of Systems Review of Systems  Constitutional: Positive for chills, fatigue and fever.  Respiratory: Positive for cough.   Musculoskeletal: Positive for arthralgias and myalgias.  All other systems reviewed and are negative.    Physical Exam Updated Vital Signs BP 117/75 (BP Location: Right Arm)   Pulse 66   Temp 98.7 F (37.1 C) (Oral)   Resp 16   Ht 5\' 8"  (1.727 m)   Wt 80.7 kg (178 lb)   SpO2 100%   BMI 27.06 kg/m   Physical Exam  Constitutional: He is oriented to person, place, and time. He appears  well-developed and well-nourished. No distress.  HENT:  Head: Normocephalic and atraumatic.  Mildly dry mucous membranes, moderate posterior pharyngeal erythema without tonsillar swelling or exudates.  Eyes: Conjunctivae are normal.  Neck: Neck supple.  No meningismus  Cardiovascular: Normal rate, regular rhythm and normal heart sounds. Exam reveals no friction rub.  No murmur heard. Pulmonary/Chest: Effort normal and breath sounds normal. No respiratory distress. He has no wheezes. He has no rales.  Abdominal: Soft. Bowel sounds are normal. He exhibits no distension.  Musculoskeletal: He exhibits no edema.  Neurological: He is alert and oriented to person, place, and time. He exhibits normal  muscle tone.  Skin: Skin is warm. Capillary refill takes less than 2 seconds.  Psychiatric: He has a normal mood and affect.  Nursing note and vitals reviewed.    ED Treatments / Results  Labs (all labs ordered are listed, but only abnormal results are displayed) Labs Reviewed  COMPREHENSIVE METABOLIC PANEL - Abnormal; Notable for the following components:      Result Value   Sodium 133 (*)    Glucose, Bld 104 (*)    All other components within normal limits  CBC WITH DIFFERENTIAL/PLATELET - Abnormal; Notable for the following components:   Monocytes Absolute 1.1 (*)    All other components within normal limits  RAPID STREP SCREEN (NOT AT [facility])  CULTURE, BLOOD (ROUTINE X 2)  CULTURE, BLOOD (ROUTINE X 2)  CULTURE, GROUP A STREP ([facility])  PROTIME-INR  URINALYSIS, ROUTINE W REFLEX MICROSCOPIC  I-STAT CG4 LACTIC ACID, ED  I-STAT CG4 LACTIC ACID, ED    EKG  EKG Interpretation None       Radiology Dg Chest 2 View  Result Date: 01/14/2017 CLINICAL DATA:  Fever, current smoker EXAM: CHEST  2 VIEW COMPARISON:  None 507 chest radiograph. FINDINGS: Stable cardiomediastinal silhouette with normal heart size. No pneumothorax. No pleural effusion. Lungs appear clear, with no acute consolidative airspace disease and no pulmonary edema. IMPRESSION: No active cardiopulmonary disease. Electronically Signed   By: Delbert Phenix M.D.   On: 01/14/2017 12:28    Procedures Procedures (including critical care time)  Medications Ordered in ED Medications  acetaminophen (TYLENOL) tablet 1,000 mg (not administered)  ibuprofen (ADVIL,MOTRIN) tablet 800 mg (800 mg Oral Given 01/14/17 1028)  sodium chloride 0.9 % bolus 1,000 mL (1,000 mLs Intravenous New Bag/Given 01/14/17 1604)  ketorolac (TORADOL) 15 MG/ML injection 15 mg (15 mg Intravenous Given 01/14/17 1604)     Initial Impression / Assessment and Plan / ED Course  I have reviewed the triage vital signs and the nursing notes.  Pertinent  labs & imaging results that were available during my care of the patient were reviewed by me and considered in my medical decision making (see chart for details).     31 year old male with past medical history as above here with influenza-like illness.  He does have strep contacts will check a rapid strep.  He appears mildly dehydrated but lab work is very reassuring.  No signs of sepsis.  Will give him 1 L of fluids and symptomatic control, with plan to discharge if he remains afebrile and well appearing. Abdomen soft, NT, ND.  Rapid strep negative.  Patient feels improved with fluids.  Will DC with supportive care, Tamiflu, and NSAIDs as needed myalgias.  Return precautions given.  Final Clinical Impressions(s) / ED Diagnoses   Final diagnoses:  Influenza-like illness  Dehydration    ED Discharge Orders        Ordered  oseltamivir (TAMIFLU) 75 MG capsule  Every 12 hours     01/14/17 1705    ondansetron (ZOFRAN ODT) 4 MG disintegrating tablet  Every 8 hours PRN     01/14/17 1705    ibuprofen (ADVIL,MOTRIN) 600 MG tablet  Every 6 hours PRN     01/14/17 1705       Shaune PollackIsaacs, Sherril Heyward, MD 01/14/17 1709

## 2017-01-17 LAB — CULTURE, GROUP A STREP (THRC)

## 2017-01-19 LAB — CULTURE, BLOOD (ROUTINE X 2)
Culture: NO GROWTH
Culture: NO GROWTH
SPECIAL REQUESTS: ADEQUATE

## 2018-01-11 ENCOUNTER — Emergency Department (HOSPITAL_COMMUNITY)
Admission: EM | Admit: 2018-01-11 | Discharge: 2018-01-11 | Disposition: A | Discharge status: Home / Self Care | Payer: Self-pay | Attending: Emergency Medicine | Admitting: Emergency Medicine

## 2018-01-11 ENCOUNTER — Other Ambulatory Visit: Payer: Self-pay

## 2018-01-11 DIAGNOSIS — J45909 Unspecified asthma, uncomplicated: Secondary | ICD-10-CM | POA: Insufficient documentation

## 2018-01-11 DIAGNOSIS — R55 Syncope and collapse: Secondary | ICD-10-CM

## 2018-01-11 DIAGNOSIS — F10920 Alcohol use, unspecified with intoxication, uncomplicated: Secondary | ICD-10-CM

## 2018-01-11 DIAGNOSIS — F10220 Alcohol dependence with intoxication, uncomplicated: Secondary | ICD-10-CM | POA: Insufficient documentation

## 2018-01-11 DIAGNOSIS — F17210 Nicotine dependence, cigarettes, uncomplicated: Secondary | ICD-10-CM | POA: Insufficient documentation

## 2018-01-11 LAB — COMPREHENSIVE METABOLIC PANEL
ALT: 35 U/L (ref 0–44)
AST: 30 U/L (ref 15–41)
Albumin: 4.8 g/dL (ref 3.5–5.0)
Alkaline Phosphatase: 73 U/L (ref 38–126)
Anion gap: 12 (ref 5–15)
BUN: 5 mg/dL — ABNORMAL LOW (ref 6–20)
CHLORIDE: 107 mmol/L (ref 98–111)
CO2: 24 mmol/L (ref 22–32)
Calcium: 8.9 mg/dL (ref 8.9–10.3)
Creatinine, Ser: 0.9 mg/dL (ref 0.61–1.24)
Glucose, Bld: 105 mg/dL — ABNORMAL HIGH (ref 70–99)
POTASSIUM: 3.8 mmol/L (ref 3.5–5.1)
SODIUM: 143 mmol/L (ref 135–145)
Total Bilirubin: 0.6 mg/dL (ref 0.3–1.2)
Total Protein: 8.6 g/dL — ABNORMAL HIGH (ref 6.5–8.1)

## 2018-01-11 LAB — CBC WITH DIFFERENTIAL/PLATELET
ABS IMMATURE GRANULOCYTES: 0.04 10*3/uL (ref 0.00–0.07)
BASOS ABS: 0 10*3/uL (ref 0.0–0.1)
BASOS PCT: 0 %
Eosinophils Absolute: 0 10*3/uL (ref 0.0–0.5)
Eosinophils Relative: 0 %
HCT: 48.7 % (ref 39.0–52.0)
HEMOGLOBIN: 16.1 g/dL (ref 13.0–17.0)
IMMATURE GRANULOCYTES: 1 %
LYMPHS PCT: 27 %
Lymphs Abs: 2 10*3/uL (ref 0.7–4.0)
MCH: 30.9 pg (ref 26.0–34.0)
MCHC: 33.1 g/dL (ref 30.0–36.0)
MCV: 93.5 fL (ref 80.0–100.0)
Monocytes Absolute: 0.5 10*3/uL (ref 0.1–1.0)
Monocytes Relative: 7 %
NEUTROS ABS: 4.8 10*3/uL (ref 1.7–7.7)
NEUTROS PCT: 65 %
PLATELETS: 325 10*3/uL (ref 150–400)
RBC: 5.21 MIL/uL (ref 4.22–5.81)
RDW: 12.7 % (ref 11.5–15.5)
WBC: 7.4 10*3/uL (ref 4.0–10.5)
nRBC: 0 % (ref 0.0–0.2)

## 2018-01-11 LAB — ETHANOL: ALCOHOL ETHYL (B): 252 mg/dL — AB (ref <10)

## 2018-01-11 LAB — ACETAMINOPHEN LEVEL: Acetaminophen (Tylenol), Serum: <10 ug/mL — ABNORMAL LOW (ref 10–30)

## 2018-01-11 LAB — SALICYLATE LEVEL

## 2018-01-11 NOTE — Discharge Instructions (Addendum)
Return for any thoughts of harming yourself or others.

## 2018-01-11 NOTE — ED Notes (Signed)
Patient verbalizes understanding of discharge instructions. Opportunity for questioning and answers were provided. Armband removed by staff, pt discharged from ED ambulatory to home.  

## 2018-01-11 NOTE — ED Triage Notes (Signed)
Pt brought in by [employer] for medical clearance. Pt is visible intoxicated at this time. Pt reports drinking alcohol and smoking marijuana.

## 2018-01-11 NOTE — ED Provider Notes (Addendum)
[facility]   CSN: [id number] Arrival date & time: 01/11/18  0756     History   Chief Complaint Chief Complaint  Patient presents with  . Medical Clearance    HPI Robert Dillon is a 32 y.o. male who is BIB [employer] for ETOH intoxication and self threatening behavior. There is a level 5 caveat due to etoh intoxication. [employer] was called to the residence of the patient who is Very intoxicated with alcohol. According to [employer], patient has multiple warrants for his arrest and will be remanded to their custody when medically clear. Patient apparently had a knife and was holding it to his neck threatening to cut his own throat. Patient is currently unreliable to assess for suicidal intent secondary to inebriation. He admits to smoking marijuana and cigarettes. He denies any other drugs.   HPI  Past Medical History:  Diagnosis Date  . Asthma     There are no active problems to display for this patient.   Past Surgical History:  Procedure Laterality Date  . INCISION AND DRAINAGE Right 12/16/2013   Procedure: INCISION AND DRAINAGE AND CLOSURE OF RIGHT LEG LACERATION;  Surgeon: Toni ArthursJohn Hewitt, MD;  Location: [facility] OR;  Service: Orthopedics;  Laterality: Right;        Home Medications    Prior to Admission medications   Medication Sig Start Date End Date Taking? Authorizing Provider  cephALEXin (KEFLEX) 500 MG capsule Take 1 capsule (500 mg total) by mouth 4 (four) times daily. 12/16/13   Toni ArthursHewitt, John, MD  HYDROcodone-acetaminophen (NORCO) 5-325 MG per tablet Take 1-2 tablets by mouth every 6 (six) hours as needed for moderate pain. 12/16/13   Toni ArthursHewitt, John, MD  ibuprofen (ADVIL,MOTRIN) 600 MG tablet Take 1 tablet (600 mg total) by mouth every 6 (six) hours as needed for moderate pain. 01/14/17   Shaune PollackIsaacs, Cameron, MD  ondansetron (ZOFRAN ODT) 4 MG disintegrating tablet Take 1 tablet (4 mg total) by mouth every 8 (eight) hours as needed for  nausea or vomiting. 01/14/17   Shaune PollackIsaacs, Cameron, MD  oseltamivir (TAMIFLU) 75 MG capsule Take 1 capsule (75 mg total) by mouth every 12 (twelve) hours. 01/14/17   Shaune PollackIsaacs, Cameron, MD    Family History No family history on file.  Social History Social History   Tobacco Use  . Smoking status: Current Every Day Smoker    Packs/day: 0.50    Types: Cigarettes  . Smokeless tobacco: Current User  Substance Use Topics  . Alcohol use: Yes    Comment: socially  . Drug use: Yes    Types: Marijuana     Allergies   Penicillins   Review of Systems Review of Systems  Unable to perform ROS: Mental status change  Intoxication   Physical Exam Updated Vital Signs BP 115/63   Pulse 65   Temp (!) 97.1 F (36.2 C)   Resp 16   Ht 5\' 8"  (1.727 m)   Wt 79.4 kg   SpO2 100%   BMI 26.61 kg/m   Physical Exam Vitals signs and nursing note reviewed.  Constitutional:      General: He is not in acute distress.    Appearance: He is well-developed. He is not diaphoretic.  HENT:     Head: Normocephalic and atraumatic.  Eyes:     General: No scleral icterus.    Conjunctiva/sclera: Conjunctivae normal.  Neck:     Musculoskeletal: Normal range of motion and neck supple.  Cardiovascular:  Rate and Rhythm: Normal rate and regular rhythm.     Heart sounds: Normal heart sounds.  Pulmonary:     Effort: Pulmonary effort is normal. No respiratory distress.     Breath sounds: Normal breath sounds.  Abdominal:     Palpations: Abdomen is soft.     Tenderness: There is no abdominal tenderness.  Skin:    General: Skin is warm and dry.  Neurological:     Mental Status: He is alert.  Psychiatric:     Comments: Loud, clinically intoxicated      ED Treatments / Results  Labs (all labs ordered are listed, but only abnormal results are displayed) Labs Reviewed  COMPREHENSIVE METABOLIC PANEL - Abnormal; Notable for the following components:      Result Value   Glucose, Bld 105 (*)    BUN  5 (*)    Total Protein 8.6 (*)    All other components within normal limits  ETHANOL - Abnormal; Notable for the following components:   Alcohol, Ethyl (B) 252 (*)    All other components within normal limits  ACETAMINOPHEN LEVEL - Abnormal; Notable for the following components:   Acetaminophen (Tylenol), Serum <10 (*)    All other components within normal limits  CBC WITH DIFFERENTIAL/PLATELET  SALICYLATE LEVEL  RAPID URINE DRUG SCREEN, HOSP PERFORMED    EKG None  Radiology No results found.  Procedures Procedures (including critical care time)  Medications Ordered in ED Medications - No data to display   Initial Impression / Assessment and Plan / ED Course  I have reviewed the triage vital signs and the nursing notes.  Pertinent labs & imaging results that were available during my care of the patient were reviewed by me and considered in my medical decision making (see chart for details).  Clinical Course as of Jan 12 1532  Wed Jan 11, 2018  1411 Patient awake and ambulates easily to the bathroom.  He had no recollection of last night's events and states that he is not suicidal and was surprised to find himself woken up in the hospital.  Patient appears appropriate for discharge at this time.   [AH]    Clinical Course User Index [AH] Arthor CaptainHarris, Paisely Brick, PA-C      Final Clinical Impressions(s) / ED Diagnoses   Final diagnoses:  Alcoholic intoxication without complication [facility](HCC)  Blackout    ED Discharge Orders    None       Arthor CaptainHarris, Stanisha Lorenz, PA-C 01/11/18 1534    Raeford RazorKohut, Stephen, MD 01/11/18 1703    Arthor CaptainHarris, Mirza Fessel, PA-C 02/24/18 [id number]    Raeford RazorKohut, Stephen, MD 02/24/18 1004

## 2019-01-21 ENCOUNTER — Other Ambulatory Visit: Payer: Self-pay

## 2019-01-21 ENCOUNTER — Emergency Department (HOSPITAL_COMMUNITY): Payer: Self-pay

## 2019-01-21 ENCOUNTER — Encounter (HOSPITAL_COMMUNITY): Payer: Self-pay

## 2019-01-21 ENCOUNTER — Emergency Department (HOSPITAL_COMMUNITY)
Admission: EM | Admit: 2019-01-21 | Discharge: 2019-01-22 | Disposition: A | Discharge status: Home / Self Care | Payer: Self-pay | Attending: Emergency Medicine | Admitting: Emergency Medicine

## 2019-01-21 DIAGNOSIS — F17210 Nicotine dependence, cigarettes, uncomplicated: Secondary | ICD-10-CM | POA: Insufficient documentation

## 2019-01-21 DIAGNOSIS — N200 Calculus of kidney: Secondary | ICD-10-CM | POA: Insufficient documentation

## 2019-01-21 DIAGNOSIS — J45909 Unspecified asthma, uncomplicated: Secondary | ICD-10-CM | POA: Insufficient documentation

## 2019-01-21 DIAGNOSIS — F1210 Cannabis abuse, uncomplicated: Secondary | ICD-10-CM | POA: Insufficient documentation

## 2019-01-21 LAB — CBC WITH DIFFERENTIAL/PLATELET
Abs Immature Granulocytes: 0.04 10*3/uL (ref 0.00–0.07)
Basophils Absolute: 0 10*3/uL (ref 0.0–0.1)
Basophils Relative: 0 %
Eosinophils Absolute: 0 10*3/uL (ref 0.0–0.5)
Eosinophils Relative: 0 %
HCT: 47.1 % (ref 39.0–52.0)
Hemoglobin: 16.2 g/dL (ref 13.0–17.0)
Immature Granulocytes: 0 %
Lymphocytes Relative: 27 %
Lymphs Abs: 2.9 10*3/uL (ref 0.7–4.0)
MCH: 32.4 pg (ref 26.0–34.0)
MCHC: 34.4 g/dL (ref 30.0–36.0)
MCV: 94.2 fL (ref 80.0–100.0)
Monocytes Absolute: 0.9 10*3/uL (ref 0.1–1.0)
Monocytes Relative: 8 %
Neutro Abs: 6.8 10*3/uL (ref 1.7–7.7)
Neutrophils Relative %: 65 %
Platelets: 299 10*3/uL (ref 150–400)
RBC: 5 MIL/uL (ref 4.22–5.81)
RDW: 12.6 % (ref 11.5–15.5)
WBC: 10.7 10*3/uL — ABNORMAL HIGH (ref 4.0–10.5)
nRBC: 0 % (ref 0.0–0.2)

## 2019-01-21 LAB — COMPREHENSIVE METABOLIC PANEL
ALT: 19 U/L (ref 0–44)
AST: 22 U/L (ref 15–41)
Albumin: 4.2 g/dL (ref 3.5–5.0)
Alkaline Phosphatase: 69 U/L (ref 38–126)
Anion gap: 13 (ref 5–15)
BUN: 7 mg/dL (ref 6–20)
CO2: 24 mmol/L (ref 22–32)
Calcium: 8.9 mg/dL (ref 8.9–10.3)
Chloride: 100 mmol/L (ref 98–111)
Creatinine, Ser: 0.93 mg/dL (ref 0.61–1.24)
GFR calc Af Amer: >60 mL/min (ref >60)
GFR calc non Af Amer: >60 mL/min (ref >60)
Glucose, Bld: 80 mg/dL (ref 70–99)
Potassium: 3.4 mmol/L — ABNORMAL LOW (ref 3.5–5.1)
Sodium: 137 mmol/L (ref 135–145)
Total Bilirubin: 0.5 mg/dL (ref 0.3–1.2)
Total Protein: 7.5 g/dL (ref 6.5–8.1)

## 2019-01-21 LAB — URINALYSIS, ROUTINE W REFLEX MICROSCOPIC
Bilirubin Urine: NEGATIVE
Glucose, UA: NEGATIVE mg/dL
Hgb urine dipstick: NEGATIVE
Ketones, ur: NEGATIVE mg/dL
Leukocytes,Ua: NEGATIVE
Nitrite: NEGATIVE
Protein, ur: NEGATIVE mg/dL
Specific Gravity, Urine: 1.009 (ref 1.005–1.030)
pH: 6 (ref 5.0–8.0)

## 2019-01-21 LAB — LIPASE, BLOOD: Lipase: 35 U/L (ref 11–51)

## 2019-01-21 MED ORDER — KETOROLAC TROMETHAMINE 15 MG/ML IJ SOLN
15.0000 mg | Freq: Once | INTRAMUSCULAR | Status: AC
  Administered 2019-01-22: 15 mg via INTRAMUSCULAR
  Filled 2019-01-21: qty 1

## 2019-01-21 NOTE — ED Provider Notes (Signed)
[facility] EMERGENCY DEPARTMENT Provider Note   CSN: [id number] Arrival date & time: 01/21/19  2003     History Chief Complaint  Patient presents with  . Flank Pain    Robert Dillon is a 34 y.o. male history of asthma otherwise healthy.  Patient reports that 4 days ago he developed a left flank pain, moderate intensity throbbing sensation intermittent nonradiating, worsened with bending and improved with rest.  No medications prior to arrival.  Patient reports that 2 days ago he had 1 episode of small amount of vomiting without recurrence.  He denies any fever/chills, headache, neck pain, chest pain/shortness of breath, cough, abdominal pain, recurrence of vomiting, diarrhea, dysuria/hematuria, testicular pain/swelling, fall/injury, numbness/weakness, tingling, saddle paresthesias, bowel/bladder incontinence, urinary retention or any additional concerns.  Patient is concerned for kidney stone today reports family members with similar, he denies any personal history of kidney stones. HPI     Past Medical History:  Diagnosis Date  . Asthma     There are no problems to display for this patient.   Past Surgical History:  Procedure Laterality Date  . INCISION AND DRAINAGE Right 12/16/2013   Procedure: INCISION AND DRAINAGE AND CLOSURE OF RIGHT LEG LACERATION;  Surgeon: Toni Arthurs, MD;  Location: [facility] OR;  Service: Orthopedics;  Laterality: Right;       No family history on file.  Social History   Tobacco Use  . Smoking status: Current Every Day Smoker    Packs/day: 0.50    Types: Cigarettes  . Smokeless tobacco: Current User  Substance Use Topics  . Alcohol use: Yes    Comment: socially  . Drug use: Yes    Types: Marijuana    Home Medications Prior to Admission medications   Medication Sig Start Date End Date Taking? Authorizing Provider  HYDROcodone-acetaminophen (NORCO/VICODIN) 5-325 MG tablet Take 1-2 tablets by mouth every 6 (six) hours  as needed. 01/22/19   Harlene Salts A, PA-C  ondansetron (ZOFRAN ODT) 4 MG disintegrating tablet Take 1 tablet (4 mg total) by mouth every 8 (eight) hours as needed for nausea or vomiting. 01/22/19   Harlene Salts A, PA-C  tamsulosin (FLOMAX) 0.4 MG CAPS capsule Take 1 capsule (0.4 mg total) by mouth daily for 7 days. 01/22/19 01/29/19  Bill Salinas, PA-C    Allergies    Penicillins  Review of Systems   Review of Systems Ten systems are reviewed and are negative for acute change except as noted in the HPI  Physical Exam Updated Vital Signs BP 121/82 (BP Location: Right Arm)   Pulse 81   Temp 98.2 F (36.8 C) (Oral)   Resp 18   SpO2 100%   Physical Exam Constitutional:      General: He is not in acute distress.    Appearance: Normal appearance. He is well-developed. He is not ill-appearing or diaphoretic.  HENT:     Head: Normocephalic and atraumatic.     Right Ear: External ear normal.     Left Ear: External ear normal.     Nose: Nose normal.  Eyes:     General: Vision grossly intact. Gaze aligned appropriately.     Pupils: Pupils are equal, round, and reactive to light.  Neck:     Trachea: Trachea and phonation normal. No tracheal deviation.  Pulmonary:     Effort: Pulmonary effort is normal. No respiratory distress.  Abdominal:     General: There is no distension.     Palpations: Abdomen is  soft.     Tenderness: There is no abdominal tenderness. There is no guarding or rebound.  Musculoskeletal:        General: Normal range of motion.     Cervical back: Normal range of motion.     Comments: No midline C/T/L spinal tenderness to palpation, no deformity, crepitus, or step-off noted. No sign of injury to the neck or back. - Ambulatory without assistance or difficulty.  Skin:    General: Skin is warm and dry.     Findings: No rash.     Comments: No rash or shingles  Neurological:     Mental Status: He is alert.     GCS: GCS eye subscore is 4. GCS verbal subscore  is 5. GCS motor subscore is 6.     Comments: Speech is clear and goal oriented, follows commands Major Cranial nerves without deficit, no facial droop Moves extremities without ataxia, coordination intact  Psychiatric:        Behavior: Behavior normal.    ED Results / Procedures / Treatments   Labs (all labs ordered are listed, but only abnormal results are displayed) Labs Reviewed  CBC WITH DIFFERENTIAL/PLATELET - Abnormal; Notable for the following components:      Result Value   WBC 10.7 (*)    All other components within normal limits  COMPREHENSIVE METABOLIC PANEL - Abnormal; Notable for the following components:   Potassium 3.4 (*)    All other components within normal limits  URINALYSIS, ROUTINE W REFLEX MICROSCOPIC  LIPASE, BLOOD    EKG None  Radiology CT Renal Stone Study  Result Date: 01/21/2019 CLINICAL DATA:  Flank pain. Kidney stones suspected. Left-sided flank pain. EXAM: CT ABDOMEN AND PELVIS WITHOUT CONTRAST TECHNIQUE: Multidetector CT imaging of the abdomen and pelvis was performed following the standard protocol without IV contrast. COMPARISON:  None. FINDINGS: Lower chest: The lung bases are clear. The heart size is normal. Hepatobiliary: The liver is normal. Normal gallbladder.There is no biliary ductal dilation. Pancreas: Normal contours without ductal dilatation. No peripancreatic fluid collection. Spleen: No splenic laceration or hematoma. Adrenals/Urinary Tract: --Adrenal glands: No adrenal hemorrhage. --Right kidney/ureter: No hydronephrosis or perinephric hematoma. --Left kidney/ureter: There is a tiny calcification in the region of the distal left ureter which may represent a small nonobstructing 1 mm stone. --Urinary bladder: Unremarkable. Stomach/Bowel: --Stomach/Duodenum: No hiatal hernia or other gastric abnormality. Normal duodenal course and caliber. --Small bowel: No dilatation or inflammation. --Colon: No focal abnormality. --Appendix: Normal.  Vascular/Lymphatic: Normal course and caliber of the major abdominal vessels. --No retroperitoneal lymphadenopathy. --No mesenteric lymphadenopathy. --No pelvic or inguinal lymphadenopathy. Reproductive: Unremarkable Other: No ascites or free air. The abdominal wall is normal. Musculoskeletal. No acute displaced fractures. IMPRESSION: 1. Questionable nonobstructing 1 mm stone in the distal left ureter. There is no hydronephrosis. 2. Otherwise, no acute abnormality detected within the abdomen or pelvis. Electronically Signed   By: Constance Holster M.D.   On: 01/21/2019 23:04    Procedures Procedures (including critical care time)  Medications Ordered in ED Medications  ketorolac (TORADOL) 15 MG/ML injection 15 mg (has no administration in time range)    ED Course  I have reviewed the triage vital signs and the nursing notes.  Pertinent labs & imaging results that were available during my care of the patient were reviewed by me and considered in my medical decision making (see chart for details).    MDM Rules/Calculators/A&P  On initial evaluation patient is well-appearing in no acute distress no active pain.  He describes a left flank pain intermittent for 4 days one episode of vomiting 2 days ago which has not reoccurred.  Is been eating well without difficulty.  He denies any urinary symptoms.  Additionally denies any testicular pain or swelling.  He has no abdominal pain, chest pain or shortness of breath.  There is no rash suggestive of shingles.  He has no sign of injury, no spinal tenderness and no neurologic complaints or cauda equina-like symptoms.  Pain is worsened with bending, possible musculoskeletal etiology patient's symptoms I will obtain a renal stone study as well as blood work, urinalysis pending. - CBC with WBC 10.7 CMP potassium 3.4 otherwise within normal limits Lipase within normal limits Urinalysis within normal limits CT renal stone study:   IMPRESSION:  1. Questionable nonobstructing 1 mm stone in the distal left ureter.  There is no hydronephrosis.  2. Otherwise, no acute abnormality detected within the abdomen or  pelvis.  - Patient reassessed resting comfortably no acute distress states understanding of work-up as above and has no questions.  Suspect patient's left flank pain due to kidney stone today, as it is 1 mm and with no evidence of infection on urinalysis there is no indication for emergent intervention.  Plan of care is to give patient 15 mg intramuscular Toradol, he denies any history of CKD, gastric ulcer or allergy to NSAIDs.  Flomax, short course of Norco (4 pills) and referral to urology.  Discussed narcotic precautions with patient he states understanding.  PDMP reviewed and patient without active narcotic prescription. Suspect mild leukocytosis is secondary to pain due to kidney stone today, no evidence of infection.  At this time there does not appear to be any evidence of an acute emergency medical condition and the patient appears stable for discharge with appropriate outpatient follow up. Diagnosis was discussed with patient who verbalizes understanding of care plan and is agreeable to discharge. I have discussed return precautions with patient who verbalizes understanding of return precautions. Patient encouraged to follow-up with their PCP and urology. All questions answered.   Note: Portions of this report may have been transcribed using voice recognition software. Every effort was made to ensure accuracy; however, inadvertent computerized transcription errors may still be present. Final Clinical Impression(s) / ED Diagnoses Final diagnoses:  Kidney stone on left side    Rx / DC Orders ED Discharge Orders         Ordered    tamsulosin (FLOMAX) 0.4 MG CAPS capsule  Daily     01/22/19 0002    ondansetron (ZOFRAN ODT) 4 MG disintegrating tablet  Every 8 hours PRN     01/22/19 0002     HYDROcodone-acetaminophen (NORCO/VICODIN) 5-325 MG tablet  Every 6 hours PRN     01/22/19 0005           Bill Salinas, PA-C 01/22/19 0007    Arby Barrette, MD 01/29/19 1123

## 2019-01-21 NOTE — ED Triage Notes (Signed)
Pt reports that he has been having L sided flank pain for 4 days, denies urinary symptoms , vomiting x 1

## 2019-01-21 NOTE — Discharge Instructions (Addendum)
You have been diagnosed today with Left Sided Kidney Stone.  At this time there does not appear to be the presence of an emergent medical condition, however there is always the potential for conditions to change. Please read and follow the below instructions.  Please return to the Emergency Department immediately for any new or worsening symptoms or if your symptoms do not improve within 3 days. Please be sure to follow up with your Primary Care Provider within one week regarding your visit today; please call their office to schedule an appointment even if you are feeling better for a follow-up visit. You have been given an NSAID-containing medication called Toradol today.  Do not take the medications including ibuprofen, Aleve, Advil, naproxen or other NSAID-containing medications for the next 2 days.  Please be sure to drink plenty of water over the next few days. Please take the medication Flomax as prescribed to help facilitate passage of your kidney stone. You may use the medication Zofran as prescribed to help with nausea and vomiting. You may use the pain medication Norco as prescribed for severe pain.  Do not drive or perform dangerous activities while taking Norco as it will make you drowsy.  Do not drink alcohol or take any sedating medications with Norco as this will worsen side effects.  Norco contains Tylenol, do not take any additional Tylenol while you take Norco. Please call the urologist Dr. Annabell Howells on your discharge paperwork to schedule a follow-up appointment for your kidney stone.  Get help right away if: You have a fever or chills. You get very bad pain. You get new pain in your belly (abdomen). You pass out (faint). You cannot pee. You have any new/concerning or worsening of symptoms.  Please read the additional information packets attached to your discharge summary.  Do not take your medicine if  develop an itchy rash, swelling in your mouth or lips, or difficulty  breathing; call 911 and seek immediate emergency medical attention if this occurs.  Note: Portions of this text may have been transcribed using voice recognition software. Every effort was made to ensure accuracy; however, inadvertent computerized transcription errors may still be present.

## 2019-01-22 MED ORDER — ONDANSETRON 4 MG PO TBDP: 4.0000 mg | ORAL_TABLET | Freq: Three times a day (TID) | ORAL | 0 refills | Status: DC | PRN

## 2019-01-22 MED ORDER — TAMSULOSIN HCL 0.4 MG PO CAPS: 0.4000 mg | ORAL_CAPSULE | Freq: Every day | ORAL | 0 refills | Status: AC

## 2019-01-22 MED ORDER — HYDROCODONE-ACETAMINOPHEN 5-325 MG PO TABS: 1.0000 | ORAL_TABLET | Freq: Four times a day (QID) | ORAL | 0 refills | Status: DC | PRN

## 2019-09-20 ENCOUNTER — Encounter (HOSPITAL_COMMUNITY): Payer: Self-pay | Admitting: Emergency Medicine

## 2019-09-20 ENCOUNTER — Other Ambulatory Visit: Payer: Self-pay

## 2019-09-20 ENCOUNTER — Ambulatory Visit (HOSPITAL_COMMUNITY)
Admission: EM | Admit: 2019-09-20 | Discharge: 2019-09-20 | Disposition: A | Discharge status: Home / Self Care | Payer: Self-pay | Attending: Family Medicine | Admitting: Family Medicine

## 2019-09-20 DIAGNOSIS — Z202 Contact with and (suspected) exposure to infections with a predominantly sexual mode of transmission: Secondary | ICD-10-CM

## 2019-09-20 MED ORDER — CEFTRIAXONE SODIUM 500 MG IJ SOLR
500.0000 mg | Freq: Once | INTRAMUSCULAR | Status: AC
  Administered 2019-09-20: 19:00:00 500 mg via INTRAMUSCULAR

## 2019-09-20 MED ORDER — AZITHROMYCIN 250 MG PO TABS
ORAL_TABLET | ORAL | Status: AC
  Filled 2019-09-20: qty 4

## 2019-09-20 MED ORDER — AZITHROMYCIN 250 MG PO TABS
1000.0000 mg | ORAL_TABLET | Freq: Once | ORAL | Status: AC
  Administered 2019-09-20: 19:00:00 1000 mg via ORAL

## 2019-09-20 NOTE — ED Triage Notes (Signed)
PT's partner was diagnosed with chlamydia and gonorrhea. PT has no symptoms, would like treatment.

## 2019-09-20 NOTE — ED Provider Notes (Signed)
[facility]   [id number] 09/20/19 Arrival Time: 1748  ASSESSMENT & PLAN:  1. STD exposure       Discharge Instructions      You have been given the following today for treatment of suspected gonorrhea and/or chlamydia:  Meds ordered this encounter  Medications  . cefTRIAXone (ROCEPHIN) injection 500 mg  . azithromycin (ZITHROMAX) tablet 1,000 mg   Please refrain from all sexual activity for at least the next seven days.      Declines urethral cytology.  Reviewed expectations re: course of current medical issues. Questions answered. Outlined signs and symptoms indicating need for more acute intervention. Patient verbalized understanding. After Visit Summary given.   SUBJECTIVE:  Robert Dillon is a 34 y.o. male who reports gonorrhea/chlamydia exposure. No penile discharge. Denies: urinary frequency, dysuria and gross hematuria. Afebrile. No abdominal or pelvic pain. No n/v. No rashes or lesions.    OBJECTIVE:  Vitals:   09/20/19 1909  BP: (!) 153/96  Pulse: 86  Resp: 16  Temp: 98.1 F (36.7 C)  TempSrc: Oral  SpO2: 99%     General appearance: alert, cooperative, appears stated age and no distress GU: deferred Skin: warm and dry Psychological: alert and cooperative; normal mood and affect.      Allergies  Allergen Reactions  . Penicillins     Past Medical History:  Diagnosis Date  . Asthma    No family history on file. Social History   Socioeconomic History  . Marital status: Single    Spouse name: Not on file  . Number of children: Not on file  . Years of education: Not on file  . Highest education level: Not on file  Occupational History  . Not on file  Tobacco Use  . Smoking status: Current Every Day Smoker    Packs/day: 0.50    Types: Cigarettes  . Smokeless tobacco: Current User  Substance and Sexual Activity  . Alcohol use: Yes    Comment: socially  . Drug use: Yes    Types: Marijuana  . Sexual activity:  Not on file  Other Topics Concern  . Not on file  Social History Narrative  . Not on file   Social Determinants of Health   Financial Resource Strain:   . Difficulty of Paying Living Expenses: Not on file  Food Insecurity:   . Worried About Programme researcher, broadcasting/film/video in the Last Year: Not on file  . Ran Out of Food in the Last Year: Not on file  Transportation Needs:   . Lack of Transportation (Medical): Not on file  . Lack of Transportation (Non-Medical): Not on file  Physical Activity:   . Days of Exercise per Week: Not on file  . Minutes of Exercise per Session: Not on file  Stress:   . Feeling of Stress : Not on file  Social Connections:   . Frequency of Communication with Friends and Family: Not on file  . Frequency of Social Gatherings with Friends and Family: Not on file  . Attends Religious Services: Not on file  . Active Member of Clubs or Organizations: Not on file  . Attends Banker Meetings: Not on file  . Marital Status: Not on file  Intimate Partner Violence:   . Fear of Current or Ex-Partner: Not on file  . Emotionally Abused: Not on file  . Physically Abused: Not on file  . Sexually Abused: Not on file          Robert Dillon,  Arlys John, MD 09/20/19 1919

## 2019-09-20 NOTE — Discharge Instructions (Addendum)
You have been given the following today for treatment of suspected gonorrhea and/or chlamydia:  Meds ordered this encounter  Medications   cefTRIAXone (ROCEPHIN) injection 500 mg   azithromycin (ZITHROMAX) tablet 1,000 mg   Please refrain from all sexual activity for at least the next seven days.

## 2019-11-12 ENCOUNTER — Other Ambulatory Visit: Payer: Self-pay

## 2019-11-12 ENCOUNTER — Encounter (HOSPITAL_COMMUNITY): Payer: Self-pay | Admitting: *Deleted

## 2019-11-12 ENCOUNTER — Ambulatory Visit (HOSPITAL_COMMUNITY)
Admission: EM | Admit: 2019-11-12 | Discharge: 2019-11-12 | Disposition: A | Discharge status: Home / Self Care | Payer: Self-pay | Attending: Internal Medicine | Admitting: Internal Medicine

## 2019-11-12 DIAGNOSIS — J039 Acute tonsillitis, unspecified: Secondary | ICD-10-CM

## 2019-11-12 DIAGNOSIS — J029 Acute pharyngitis, unspecified: Secondary | ICD-10-CM

## 2019-11-12 DIAGNOSIS — H9209 Otalgia, unspecified ear: Secondary | ICD-10-CM

## 2019-11-12 DIAGNOSIS — Z20822 Contact with and (suspected) exposure to covid-19: Secondary | ICD-10-CM | POA: Insufficient documentation

## 2019-11-12 LAB — POCT RAPID STREP A, ED / UC: Streptococcus, Group A Screen (Direct): NEGATIVE

## 2019-11-12 LAB — SARS CORONAVIRUS 2 (TAT 6-24 HRS): SARS Coronavirus 2: NEGATIVE

## 2019-11-12 MED ORDER — IBUPROFEN 800 MG PO TABS: 800.0000 mg | ORAL_TABLET | Freq: Three times a day (TID) | ORAL | 0 refills | Status: DC

## 2019-11-12 MED ORDER — AZITHROMYCIN 500 MG PO TABS: 500.0000 mg | ORAL_TABLET | Freq: Every day | ORAL | 0 refills | Status: AC

## 2019-11-12 NOTE — ED Provider Notes (Signed)
[facility]    CSN: [id number] Arrival date & time: 11/12/19  0803      History   Chief Complaint Chief Complaint  Patient presents with   Sore Throat   Otalgia    HPI Robert Dillon is a 34 y.o. male history of asthma presenting today for evaluation of sore throat.  Reports that he has had a sore throat over the past 2 days.  Has had some mild associated ear pain.  Denies rhinorrhea congestion or cough.  Denies any known fevers.  Denies close sick contacts.  Denies GI symptoms.   HPI  Past Medical History:  Diagnosis Date   Asthma     There are no problems to display for this patient.   Past Surgical History:  Procedure Laterality Date   INCISION AND DRAINAGE Right 12/16/2013   Procedure: INCISION AND DRAINAGE AND CLOSURE OF RIGHT LEG LACERATION;  Surgeon: Toni Arthurs, MD;  Location: [facility] OR;  Service: Orthopedics;  Laterality: Right;       Home Medications    Prior to Admission medications   Medication Sig Start Date End Date Taking? Authorizing Provider  azithromycin (ZITHROMAX) 500 MG tablet Take 1 tablet (500 mg total) by mouth daily for 5 days. 11/12/19 11/17/19  Nana Hoselton C, PA-C  ibuprofen (ADVIL) 800 MG tablet Take 1 tablet (800 mg total) by mouth 3 (three) times daily. 11/12/19   Calistro Rauf C, PA-C  ondansetron (ZOFRAN ODT) 4 MG disintegrating tablet Take 1 tablet (4 mg total) by mouth every 8 (eight) hours as needed for nausea or vomiting. 01/22/19   Bill Salinas, PA-C    Family History History reviewed. No pertinent family history.  Social History Social History   Tobacco Use   Smoking status: Current Every Day Smoker    Packs/day: 0.50    Types: Cigarettes   Smokeless tobacco: Current User  Substance Use Topics   Alcohol use: Yes    Comment: socially   Drug use: Yes    Types: Marijuana     Allergies   Penicillins   Review of Systems Review of Systems  Constitutional: Negative for activity  change, appetite change, chills, fatigue and fever.  HENT: Positive for ear pain and sore throat. Negative for congestion, rhinorrhea, sinus pressure and trouble swallowing.   Eyes: Negative for discharge and redness.  Respiratory: Negative for cough, chest tightness and shortness of breath.   Cardiovascular: Negative for chest pain.  Gastrointestinal: Negative for abdominal pain, diarrhea, nausea and vomiting.  Musculoskeletal: Negative for myalgias.  Skin: Negative for rash.  Neurological: Negative for dizziness, light-headedness and headaches.     Physical Exam Triage Vital Signs ED Triage Vitals  Enc Vitals Group     BP 11/12/19 0831 (!) 138/94     Pulse Rate 11/12/19 0831 92     Resp 11/12/19 0831 18     Temp 11/12/19 0831 99.9 F (37.7 C)     Temp Source 11/12/19 0831 Oral     SpO2 11/12/19 0831 98 %     Weight 11/12/19 0833 180 lb (81.6 kg)     Height 11/12/19 0833 5\' 8"  (1.727 m)     Head Circumference --      Peak Flow --      Pain Score 11/12/19 0832 6     Pain Loc --      Pain Edu? --      Excl. in GC? --    No data found.  Updated Vital  Signs BP (!) 138/94 (BP Location: Left Arm)    Pulse 92    Temp 99.9 F (37.7 C) (Oral)    Resp 18    Ht 5\' 8"  (1.727 m)    Wt 180 lb (81.6 kg)    SpO2 98%    BMI 27.37 kg/m   Visual Acuity Right Eye Distance:   Left Eye Distance:   Bilateral Distance:    Right Eye Near:   Left Eye Near:    Bilateral Near:     Physical Exam Vitals and nursing note reviewed.  Constitutional:      Appearance: He is well-developed.     Comments: No acute distress  HENT:     Head: Normocephalic and atraumatic.     Ears:     Comments: Bilateral ears without tenderness to palpation of external auricle, tragus and mastoid, EAC's without erythema or swelling, TM's with good bony landmarks and cone of light. Non erythematous.     Nose: Nose normal.     Mouth/Throat:     Comments: Bilateral tonsils moderately enlarged, mildly erythematous  with faint exudate noted, posterior pharynx patent, uvula midline, no soft palate swelling Eyes:     Conjunctiva/sclera: Conjunctivae normal.  Cardiovascular:     Rate and Rhythm: Normal rate.  Pulmonary:     Effort: Pulmonary effort is normal. No respiratory distress.     Comments: Breathing comfortably at rest, CTABL, no wheezing, rales or other adventitious sounds auscultated Abdominal:     General: There is no distension.  Musculoskeletal:        General: Normal range of motion.     Cervical back: Neck supple.  Skin:    General: Skin is warm and dry.  Neurological:     Mental Status: He is alert and oriented to person, place, and time.      UC Treatments / Results  Labs (all labs ordered are listed, but only abnormal results are displayed) Labs Reviewed  SARS CORONAVIRUS 2 (TAT 6-24 HRS)  CULTURE, GROUP A STREP [facility])  POCT RAPID STREP A, ED / UC    EKG   Radiology No results found.  Procedures Procedures (including critical care time)  Medications Ordered in UC Medications - No data to display  Initial Impression / Assessment and Plan / UC Course  I have reviewed the triage vital signs and the nursing notes.  Pertinent labs & imaging results that were available during my care of the patient were reviewed by me and considered in my medical decision making (see chart for details).     Strep test negative, Covid test pending.  Treating for tonsillitis given appearance of throat along with low-grade fever.  Has allergies to penicillins, placing on azithromycin 500 mg daily for 5 days.  Symptomatic and supportive care as well.  Discussed strict return precautions. Patient verbalized understanding and is agreeable with plan.  Final Clinical Impressions(s) / UC Diagnoses   Final diagnoses:  Acute tonsillitis, unspecified etiology     Discharge Instructions     Sore Throat  Your rapid strep tested Negative today. COVID pending, begin Azithromycin daily for  5 days  Please continue Tylenol or Ibuprofen for fever and pain. May try salt water gargles, cepacol lozenges, throat spray, or OTC cold relief medicine for throat discomfort. If you also have congestion take a daily anti-histamine like Zyrtec, Claritin, and a oral decongestant to help with post nasal drip that may be irritating your throat.   Stay hydrated and drink plenty  of fluids to keep your throat coated relieve irritation.     ED Prescriptions    Medication Sig Dispense Auth. Provider   azithromycin (ZITHROMAX) 500 MG tablet Take 1 tablet (500 mg total) by mouth daily for 5 days. 5 tablet Layli Capshaw C, PA-C   ibuprofen (ADVIL) 800 MG tablet Take 1 tablet (800 mg total) by mouth 3 (three) times daily. 21 tablet Eola Waldrep, [place] C, PA-C     PDMP not reviewed this encounter.   Lew Dawes, [place] 11/12/19 [phone]

## 2019-11-12 NOTE — ED Triage Notes (Signed)
PT reports he has had a sore throat since this weekend

## 2019-11-12 NOTE — Discharge Instructions (Addendum)
Sore Throat  Your rapid strep tested Negative today. COVID pending, begin Azithromycin daily for 5 days  Please continue Tylenol or Ibuprofen for fever and pain. May try salt water gargles, cepacol lozenges, throat spray, or OTC cold relief medicine for throat discomfort. If you also have congestion take a daily anti-histamine like Zyrtec, Claritin, and a oral decongestant to help with post nasal drip that may be irritating your throat.   Stay hydrated and drink plenty of fluids to keep your throat coated relieve irritation.

## 2019-11-14 LAB — CULTURE, GROUP A STREP (THRC)

## 2020-01-08 IMAGING — CT CT RENAL STONE PROTOCOL
2 of 4 series · 16 of 46 positions shown, 18 images · non-contrast
Comparison: None.

CLINICAL DATA: Flank pain. Kidney stones suspected. Left-sided
flank pain.

EXAM:
CT ABDOMEN AND PELVIS WITHOUT CONTRAST
TECHNIQUE: Multidetector CT imaging of the abdomen and pelvis was performed
following the standard protocol without IV contrast.

[Series 3: renal stone 5.0 · axial · 0.70mm/px · z∈[-611,-241]mm · 13 of 82 slices shown, 15 images]
[im 4/82  soft-tissue]
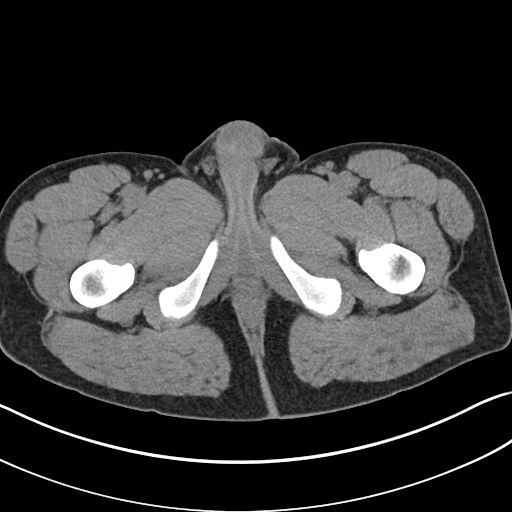
[im 4/82  bone]
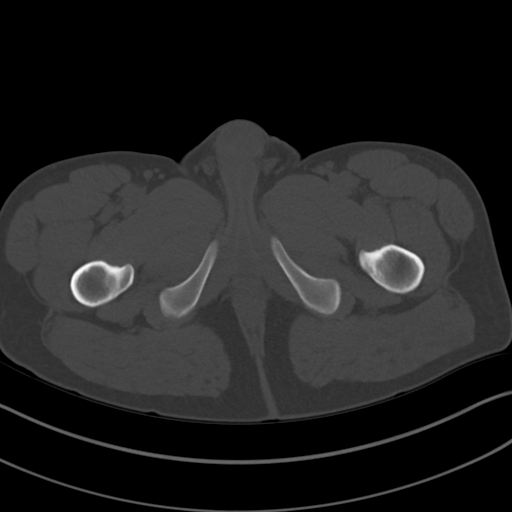
[im 12/82  soft-tissue]
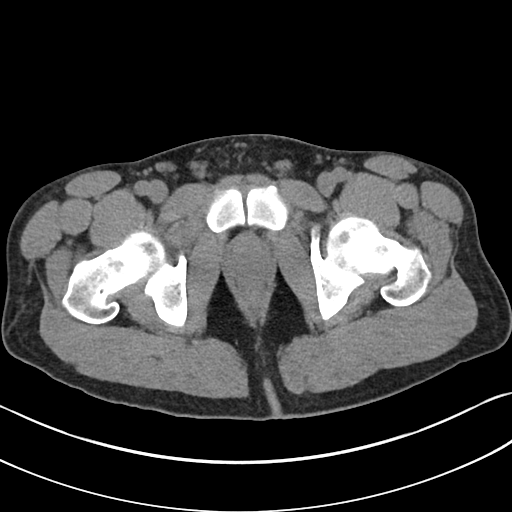
[im 19/82  soft-tissue]
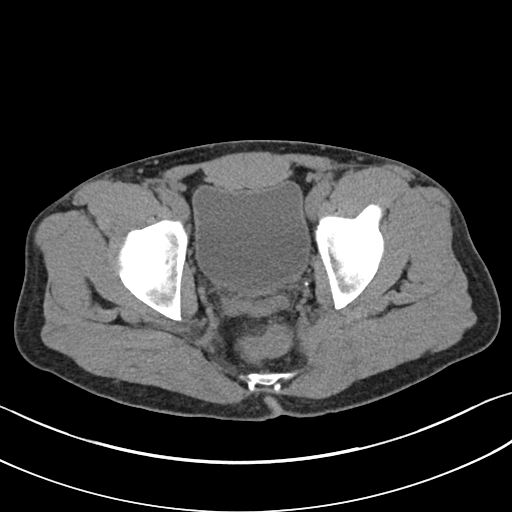
[im 23/82  soft-tissue]
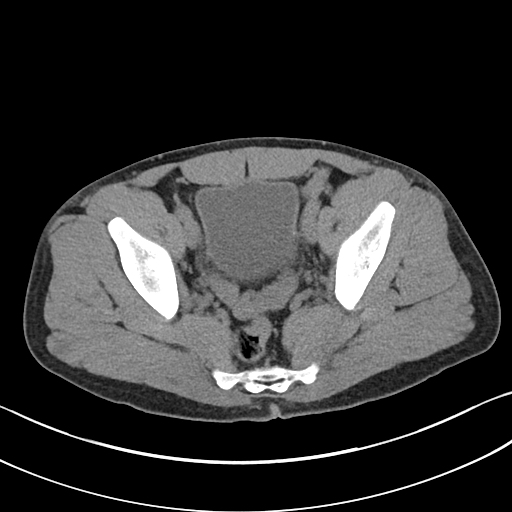
[im 30/82  soft-tissue]
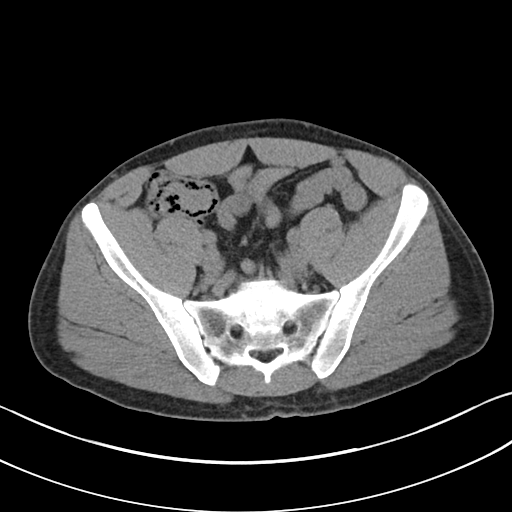
[im 34/82  soft-tissue]
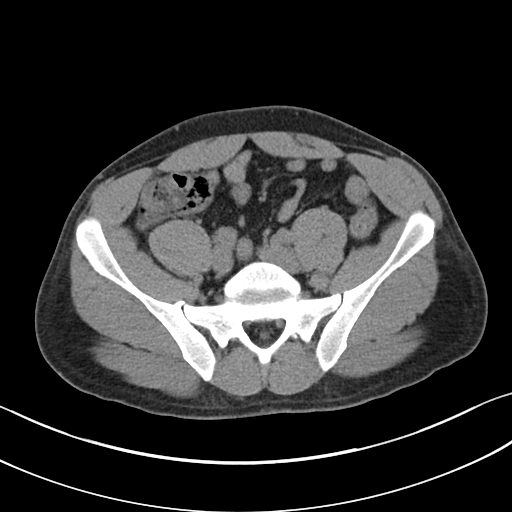
[im 41/82  soft-tissue]
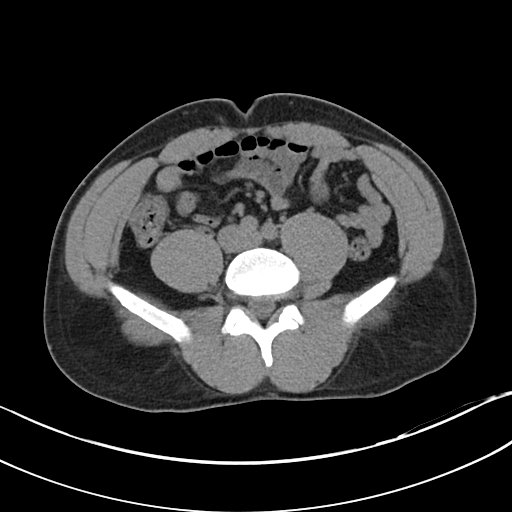
[im 48/82  soft-tissue]
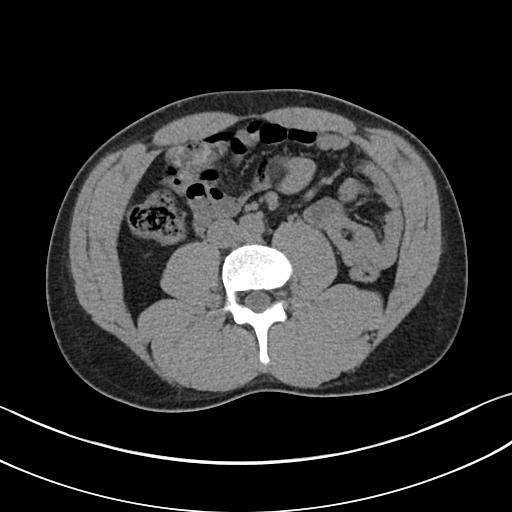
[im 52/82  soft-tissue]
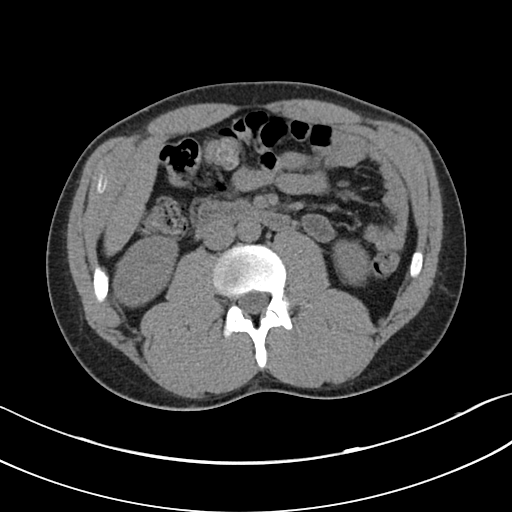
[im 52/82  bone]
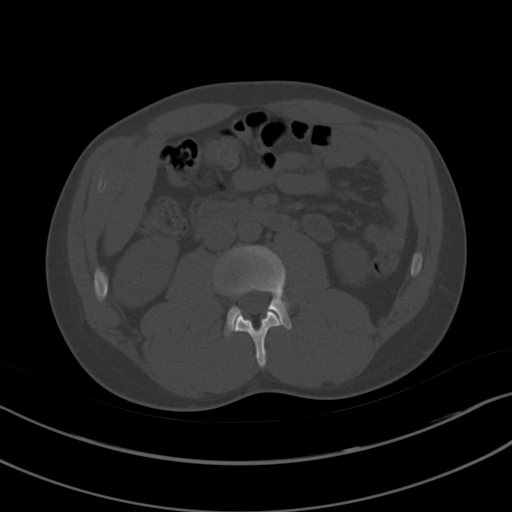
[im 59/82  soft-tissue]
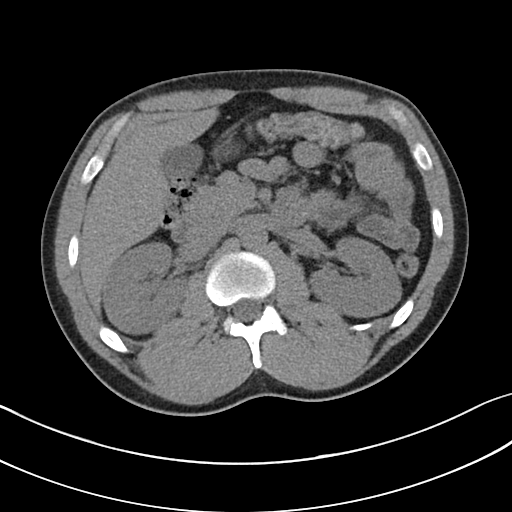
[im 63/82  soft-tissue]
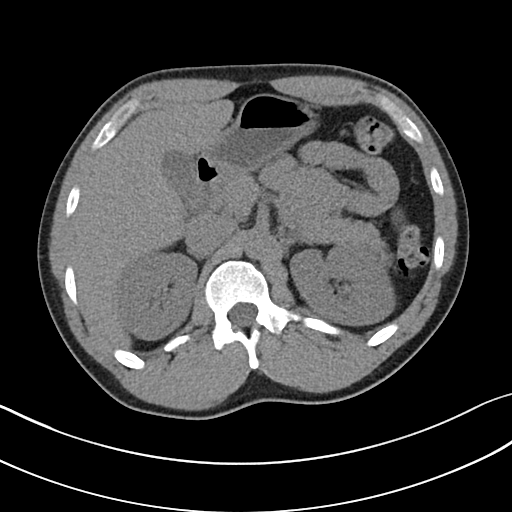
[im 70/82  soft-tissue]
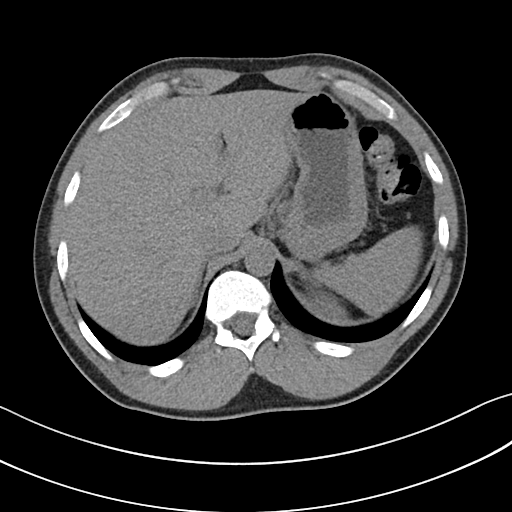
[im 78/82  soft-tissue]
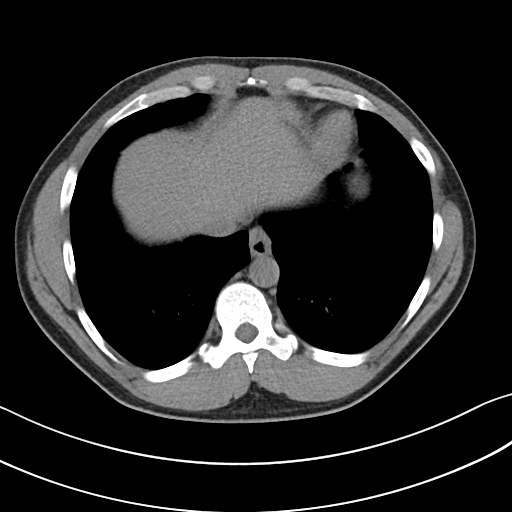

[Series 5: renal stone 3.0 cor · coronal · 0.62mm/px · 3 of 88 slices shown]
[im 30/88  soft-tissue]
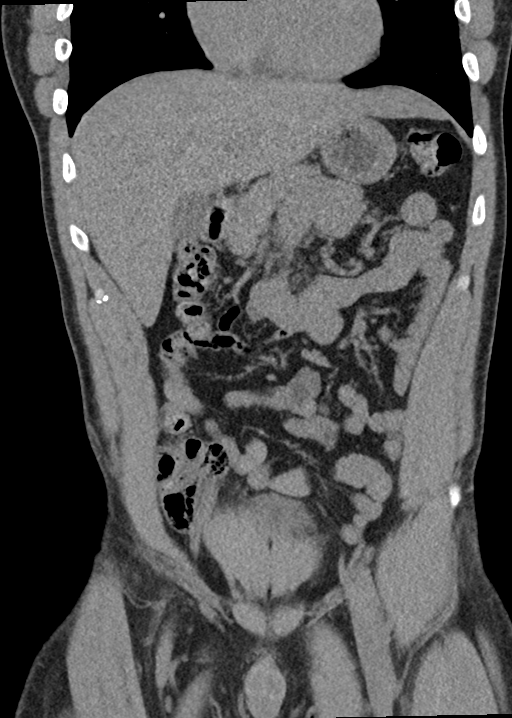
[im 39/88  soft-tissue]
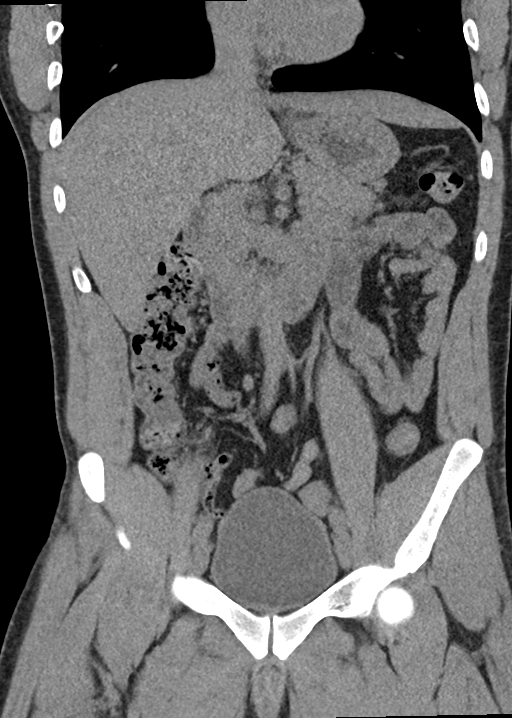
[im 49/88  soft-tissue]
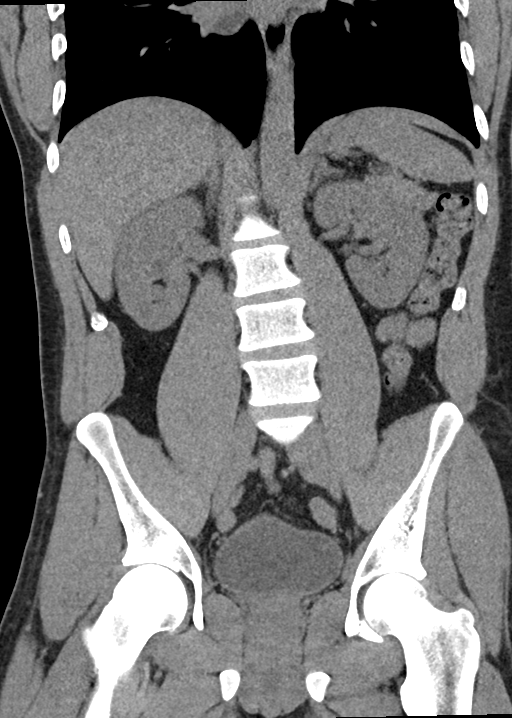

[16 of 46 positions shown; findings below may reference images not displayed]

FINDINGS: Lower chest: The lung bases are clear. The heart size is normal.

Hepatobiliary: The liver is normal. Normal gallbladder.There is no
biliary ductal dilation.

Pancreas: Normal contours without ductal dilatation. No
peripancreatic fluid collection.

Spleen: No splenic laceration or hematoma.

Adrenals/Urinary Tract:

--Adrenal glands: No adrenal hemorrhage.

--Right kidney/ureter: No hydronephrosis or perinephric hematoma.

--Left kidney/ureter: There is a tiny calcification in the region of
the distal left ureter which may represent a small nonobstructing 1
mm stone.

--Urinary bladder: Unremarkable.

Stomach/Bowel:

--Stomach/Duodenum: No hiatal hernia or other gastric abnormality.
Normal duodenal course and caliber.

--Small bowel: No dilatation or inflammation.

--Colon: No focal abnormality.

--Appendix: Normal.

Vascular/Lymphatic: Normal course and caliber of the major abdominal
vessels.

--No retroperitoneal lymphadenopathy.

--No mesenteric lymphadenopathy.

--No pelvic or inguinal lymphadenopathy.

Reproductive: Unremarkable

Other: No ascites or free air. The abdominal wall is normal.

Musculoskeletal. No acute displaced fractures.
IMPRESSION: 1. Questionable nonobstructing 1 mm stone in the distal left ureter.
There is no hydronephrosis.
2. Otherwise, no acute abnormality detected within the abdomen or
pelvis.

## 2020-09-15 ENCOUNTER — Emergency Department (HOSPITAL_COMMUNITY)
Admission: EM | Admit: 2020-09-15 | Discharge: 2020-09-15 | Disposition: A | Discharge status: Home / Self Care | Payer: Self-pay | Attending: Emergency Medicine | Admitting: Emergency Medicine

## 2020-09-15 ENCOUNTER — Emergency Department (HOSPITAL_COMMUNITY): Payer: Self-pay

## 2020-09-15 ENCOUNTER — Other Ambulatory Visit: Payer: Self-pay

## 2020-09-15 ENCOUNTER — Encounter (HOSPITAL_COMMUNITY): Payer: Self-pay | Admitting: Emergency Medicine

## 2020-09-15 DIAGNOSIS — F17210 Nicotine dependence, cigarettes, uncomplicated: Secondary | ICD-10-CM | POA: Insufficient documentation

## 2020-09-15 DIAGNOSIS — R0789 Other chest pain: Secondary | ICD-10-CM | POA: Insufficient documentation

## 2020-09-15 DIAGNOSIS — R079 Chest pain, unspecified: Secondary | ICD-10-CM

## 2020-09-15 DIAGNOSIS — J45909 Unspecified asthma, uncomplicated: Secondary | ICD-10-CM | POA: Insufficient documentation

## 2020-09-15 LAB — CBC
HCT: 48 % (ref 39.0–52.0)
Hemoglobin: 16.2 g/dL (ref 13.0–17.0)
MCH: 32.6 pg (ref 26.0–34.0)
MCHC: 33.8 g/dL (ref 30.0–36.0)
MCV: 96.6 fL (ref 80.0–100.0)
Platelets: 288 10*3/uL (ref 150–400)
RBC: 4.97 MIL/uL (ref 4.22–5.81)
RDW: 13.5 % (ref 11.5–15.5)
WBC: 7.4 10*3/uL (ref 4.0–10.5)
nRBC: 0 % (ref 0.0–0.2)

## 2020-09-15 LAB — BASIC METABOLIC PANEL
Anion gap: 6 (ref 5–15)
BUN: 11 mg/dL (ref 6–20)
CO2: 28 mmol/L (ref 22–32)
Calcium: 9.2 mg/dL (ref 8.9–10.3)
Chloride: 104 mmol/L (ref 98–111)
Creatinine, Ser: 1.05 mg/dL (ref 0.61–1.24)
GFR, Estimated: >60 mL/min (ref >60)
Glucose, Bld: 94 mg/dL (ref 70–99)
Potassium: 3.9 mmol/L (ref 3.5–5.1)
Sodium: 138 mmol/L (ref 135–145)

## 2020-09-15 LAB — TROPONIN I (HIGH SENSITIVITY)
Troponin I (High Sensitivity): 3 ng/L (ref <18)
Troponin I (High Sensitivity): 5 ng/L (ref <18)

## 2020-09-15 MED ORDER — NAPROXEN 500 MG PO TABS: 500.0000 mg | ORAL_TABLET | Freq: Two times a day (BID) | ORAL | 0 refills | Status: DC | PRN

## 2020-09-15 MED ORDER — KETOROLAC TROMETHAMINE 15 MG/ML IJ SOLN
30.0000 mg | Freq: Once | INTRAMUSCULAR | Status: AC
  Administered 2020-09-15: 30 mg via INTRAMUSCULAR
  Filled 2020-09-15: qty 2

## 2020-09-15 MED ORDER — METHOCARBAMOL 500 MG PO TABS: 500.0000 mg | ORAL_TABLET | Freq: Three times a day (TID) | ORAL | 0 refills | Status: DC | PRN

## 2020-09-15 NOTE — Discharge Instructions (Addendum)
You were seen in the emergency department today for chest pain. Your work-up in the emergency department has been overall reassuring. Your labs have been fairly normal and or similar to previous blood work you have had done. Your EKG and the enzyme we use to check your heart did not show an acute heart attack at this time. Your chest x-ray was normal.   - Naproxen is a nonsteroidal anti-inflammatory medication that will help with pain and swelling. Be sure to take this medication as prescribed with food, 1 pill every 12 hours,  It should be taken with food, as it can cause stomach upset, and more seriously, stomach bleeding. Do not take other nonsteroidal anti-inflammatory medications with this such as Advil, Motrin, Aleve, Mobic, Goodie Powder, or Motrin.    - Robaxin is the muscle relaxer I have prescribed, this is meant to help with muscle tightness. Be aware that this medication may make you drowsy therefore the first time you take this it should be at a time you are in an environment where you can rest. Do not drive or operate heavy machinery when taking this medication. Do not drink alcohol or take other sedating medications with this medicine such as narcotics or benzodiazepines.   You make take Tylenol per over the counter dosing with these medications.   We have prescribed you new medication(s) today. Discuss the medications prescribed today with your pharmacist as they can have adverse effects and interactions with your other medicines including over the counter and prescribed medications. Seek medical evaluation if you start to experience new or abnormal symptoms after taking one of these medicines, seek care immediately if you start to experience difficulty breathing, feeling of your throat closing, facial swelling, or rash as these could be indications of a more serious allergic reaction    We would like you to follow up closely with your primary care provide within 3 days. Return to the ER  immediately should you experience any new or worsening symptoms including but not limited to return of pain, worsened pain, vomiting, shortness of breath, dizziness, coughing up blood, fever, lightheadedness, passing out, or any other concerns that you may have.

## 2020-09-15 NOTE — ED Provider Notes (Signed)
[facility] EMERGENCY DEPARTMENT Provider Note   CSN: [id number] Arrival date & time: 09/15/20  0704     History Chief Complaint  Patient presents with   Chest Pain    Robert Dillon is a 35 y.o. male  with a hx of asthma and tobacco abuse who presents to the ED with complaints of chest pain x 3 days.  Patient states that he was intoxicated and messing around with his brother and thinks that he injured his chest 3 days ago.  He is having pain to the anterior chest that has been constant, worse with sneezing, laughing, or certain movements, no alleviating factors.  Denies fever, chills, cough, dyspnea, nausea, vomiting, leg pain/swelling, hemoptysis, recent surgery/trauma, recent long travel, hormone use, personal hx of cancer, or hx of DVT/PE.   HPI     Past Medical History:  Diagnosis Date   Asthma     There are no problems to display for this patient.   Past Surgical History:  Procedure Laterality Date   INCISION AND DRAINAGE Right 12/16/2013   Procedure: INCISION AND DRAINAGE AND CLOSURE OF RIGHT LEG LACERATION;  Surgeon: Toni Arthurs, MD;  Location: [facility] OR;  Service: Orthopedics;  Laterality: Right;       History reviewed. No pertinent family history.  Social History   Tobacco Use   Smoking status: Every Day    Packs/day: 0.50    Types: Cigarettes   Smokeless tobacco: Current  Substance Use Topics   Alcohol use: Yes    Comment: socially   Drug use: Yes    Types: Marijuana    Home Medications Prior to Admission medications   Medication Sig Start Date End Date Taking? Authorizing Provider  methocarbamol (ROBAXIN) 500 MG tablet Take 1 tablet (500 mg total) by mouth every 8 (eight) hours as needed for muscle spasms. 09/15/20  Yes Jonahtan Manseau R, PA-C  naproxen (NAPROSYN) 500 MG tablet Take 1 tablet (500 mg total) by mouth 2 (two) times daily as needed for moderate pain. 09/15/20  Yes Martha Soltys R, PA-C  ibuprofen (ADVIL) 800  MG tablet Take 1 tablet (800 mg total) by mouth 3 (three) times daily. 11/12/19   Wieters, Hallie C, PA-C  ondansetron (ZOFRAN ODT) 4 MG disintegrating tablet Take 1 tablet (4 mg total) by mouth every 8 (eight) hours as needed for nausea or vomiting. 01/22/19   Harlene Salts A, PA-C    Allergies    Penicillins  Review of Systems   Review of Systems  Constitutional:  Negative for chills and fever.  Respiratory:  Negative for cough and shortness of breath.   Cardiovascular:  Positive for chest pain. Negative for leg swelling.  Gastrointestinal:  Negative for abdominal pain, nausea and vomiting.  Neurological:  Negative for syncope.  All other systems reviewed and are negative.  Physical Exam Updated Vital Signs BP 128/88   Pulse 60   Temp 98.8 F (37.1 C) (Oral)   Resp (!) 22   SpO2 98%   Physical Exam Vitals and nursing note reviewed.  Constitutional:      General: He is not in acute distress.    Appearance: He is well-developed. He is not toxic-appearing.  HENT:     Head: Normocephalic and atraumatic.  Eyes:     General:        Right eye: No discharge.        Left eye: No discharge.     Conjunctiva/sclera: Conjunctivae normal.  Cardiovascular:  Rate and Rhythm: Normal rate and regular rhythm.     Pulses:          Radial pulses are 2+ on the right side and 2+ on the left side.  Pulmonary:     Effort: Pulmonary effort is normal. No respiratory distress.     Breath sounds: Normal breath sounds. No wheezing, rhonchi or rales.  Chest:     Chest wall: Tenderness (Diffuse anterior chest wall which reproduces patient's pain.  No overlying skin changes.) present.  Abdominal:     General: There is no distension.     Palpations: Abdomen is soft.     Tenderness: There is no abdominal tenderness.  Musculoskeletal:     Cervical back: Neck supple.     Right lower leg: No tenderness. No edema.     Left lower leg: No tenderness. No edema.  Skin:    General: Skin is warm and  dry.     Findings: No rash.  Neurological:     Mental Status: He is alert.     Comments: Clear speech.   Psychiatric:        Behavior: Behavior normal.    ED Results / Procedures / Treatments   Labs (all labs ordered are listed, but only abnormal results are displayed) Labs Reviewed  BASIC METABOLIC PANEL  CBC  TROPONIN I (HIGH SENSITIVITY)  TROPONIN I (HIGH SENSITIVITY)    EKG EKG Interpretation  Date/Time:  Monday September 15 2020 07:41:54 EDT Ventricular Rate:  68 PR Interval:  138 QRS Duration: 86 QT Interval:  376 QTC Calculation: 399 R Axis:   68 Text Interpretation: Normal sinus rhythm Early repolarization Normal ECG Confirmed by Kristine Royal [phone]) on 09/15/2020 12:58:55 PM  Radiology DG Chest 2 View  Result Date: 09/15/2020 CLINICAL DATA:  Chest pain EXAM: CHEST - 2 VIEW COMPARISON:  Chest radiograph 01/14/2017 FINDINGS: The cardiomediastinal silhouette is normal. The lungs clear, with no focal consolidation or pulmonary edema. There is no pleural effusion or pneumothorax. There is dextroscoliosis centered at L1, unchanged. There is no acute osseous abnormality. IMPRESSION: No radiographic evidence of acute cardiopulmonary process. Electronically Signed   By: Lesia Hausen M.D.   On: 09/15/2020 08:04    Procedures Procedures   Medications Ordered in ED Medications  ketorolac (TORADOL) 15 MG/ML injection 30 mg (30 mg Intramuscular Given 09/15/20 1401)    ED Course  I have reviewed the triage vital signs and the nursing notes.  Pertinent labs & imaging results that were available during my care of the patient were reviewed by me and considered in my medical decision making (see chart for details).    MDM Rules/Calculators/A&P                           Patient presents to the emergency department with chest pain. Patient nontoxic appearing, in no apparent distress, vitals with elevated BP and occasional mild bradycardia. Exam with chest wall tenderness to  palpation which reproduces patient's pain.   Additional history obtained:  Additional history obtained from chart review & nursing note review.   EKG: No STEMI, early repolarization  Lab Tests:  I reviewed, and interpreted labs, which included:  CBC, BMP, troponin: Unremarkable.   Imaging Studies ordered:  CXR ordered in triage, I independently reviewed, formal radiology impression shows: No radiographic evidence of acute cardiopulmonary process  ED Course:  Heart Pathway Score low risk- EKG without obvious acute ischemia, delta troponin negative, doubt ACS.  Patient is low risk wells, PERC negative, doubt pulmonary embolism. Pain is not a tearing sensation, symmetric pulses, no widening of mediastinum on CXR, doubt dissection. CXR w/o pneumothorax or rib fx. Unclear definitive etiology, overall favor musculoskeletal pain given reproducibility with chest wall palpation, will treat supportively with primary care follow-up. I discussed results, treatment plan, need for follow-up, and return precautions with the patient. Provided opportunity for questions, patient confirmed understanding and is in agreement with plan.   Portions of this note were generated with Scientist, clinical (histocompatibility and immunogenetics). Dictation errors may occur despite best attempts at proofreading.    Final Clinical Impression(s) / ED Diagnoses Final diagnoses:  Chest pain, unspecified type    Rx / DC Orders ED Discharge Orders          Ordered    naproxen (NAPROSYN) 500 MG tablet  2 times daily PRN        09/15/20 1353    methocarbamol (ROBAXIN) 500 MG tablet  Every 8 hours PRN        09/15/20 1353             Carol Loftin, [place] R, PA-C 09/15/20 1528    Wynetta Fines, MD 09/16/20 1009

## 2020-09-15 NOTE — ED Triage Notes (Signed)
Patient coming from home, complaint of chest pain that began on Saturday, unsure of injury.

## 2020-10-08 ENCOUNTER — Inpatient Hospital Stay: Payer: Self-pay | Admitting: Family Medicine

## 2021-09-02 IMAGING — CR DG CHEST 2V
2 series · 2 of 2 positions shown · non-contrast
Comparison: Chest radiograph 01/14/2017

CLINICAL DATA: Chest pain

EXAM:
CHEST - 2 VIEW

[chest pa]
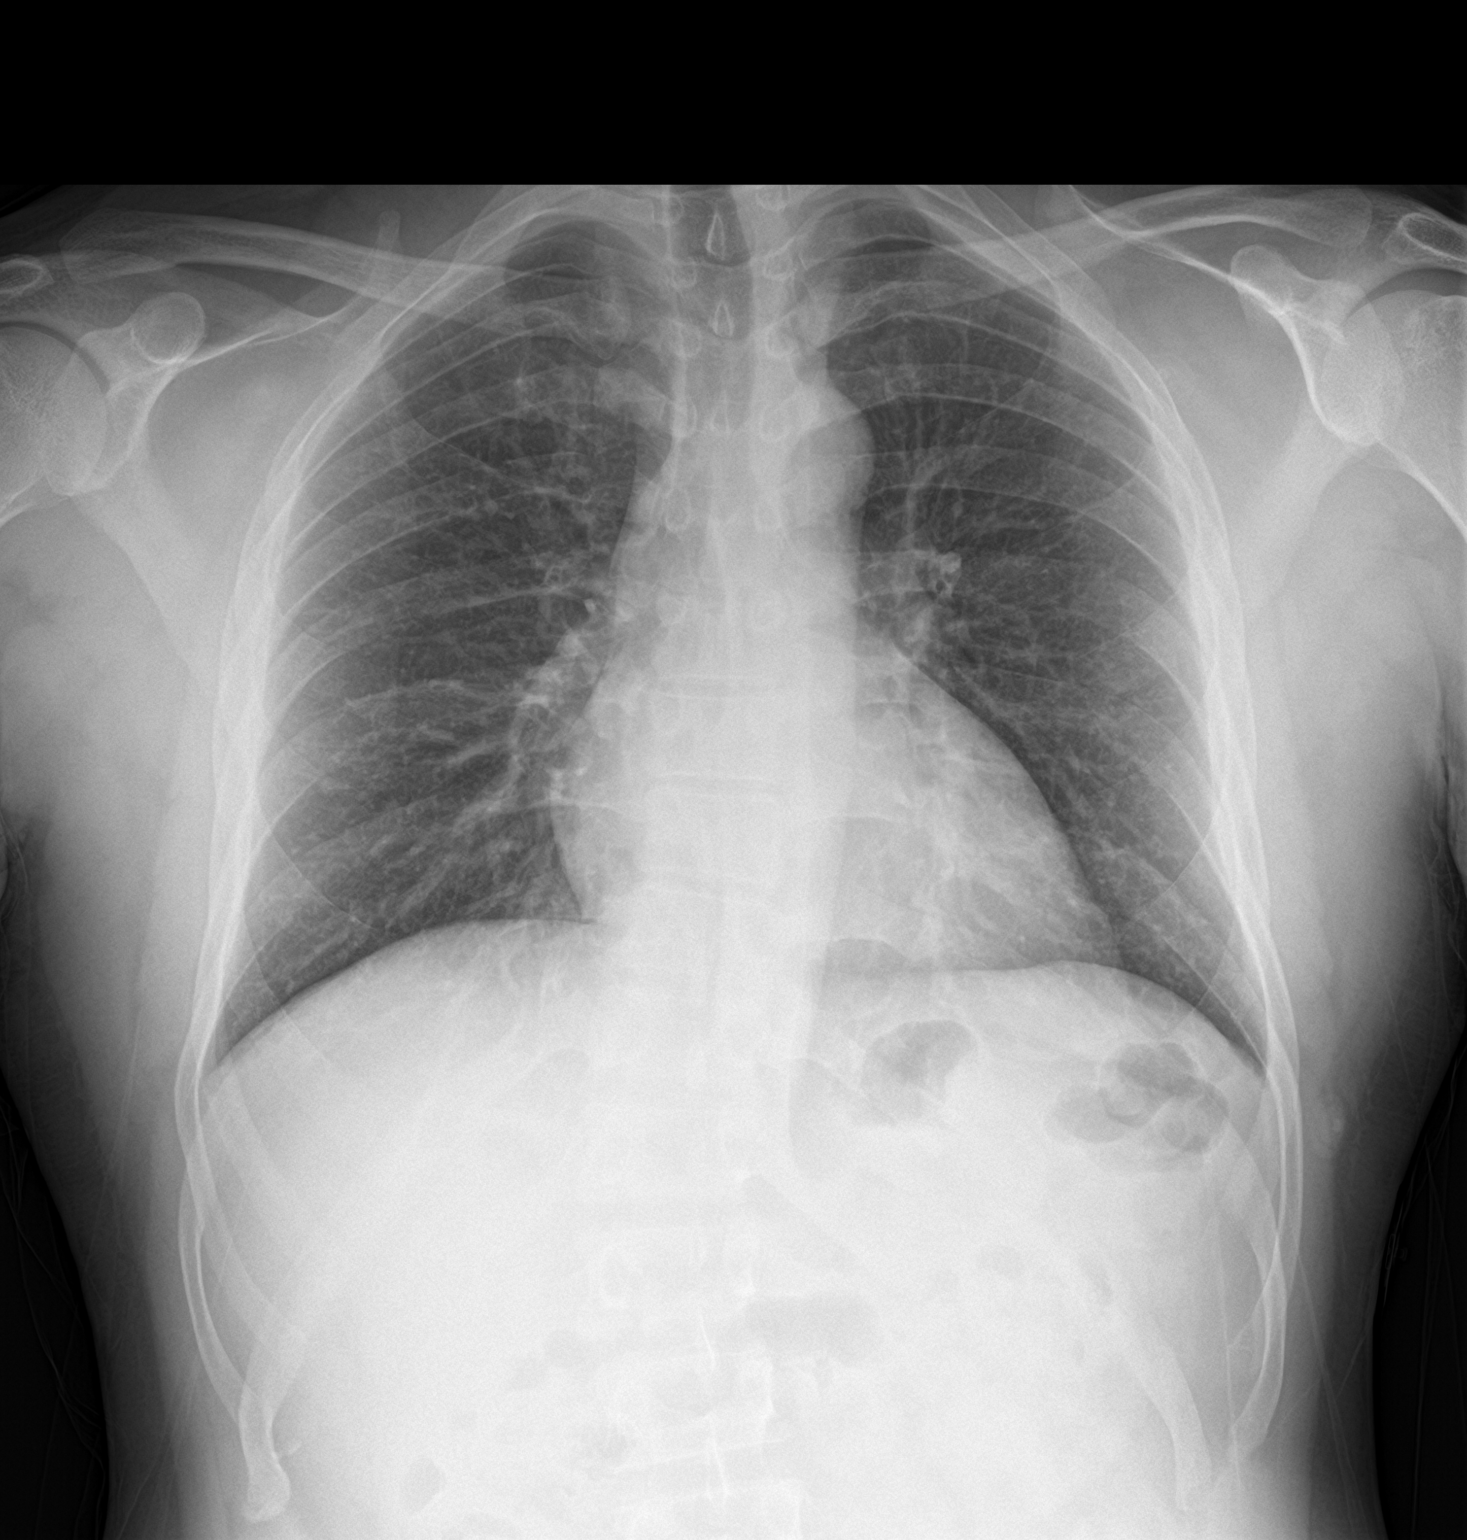

[chest lat]
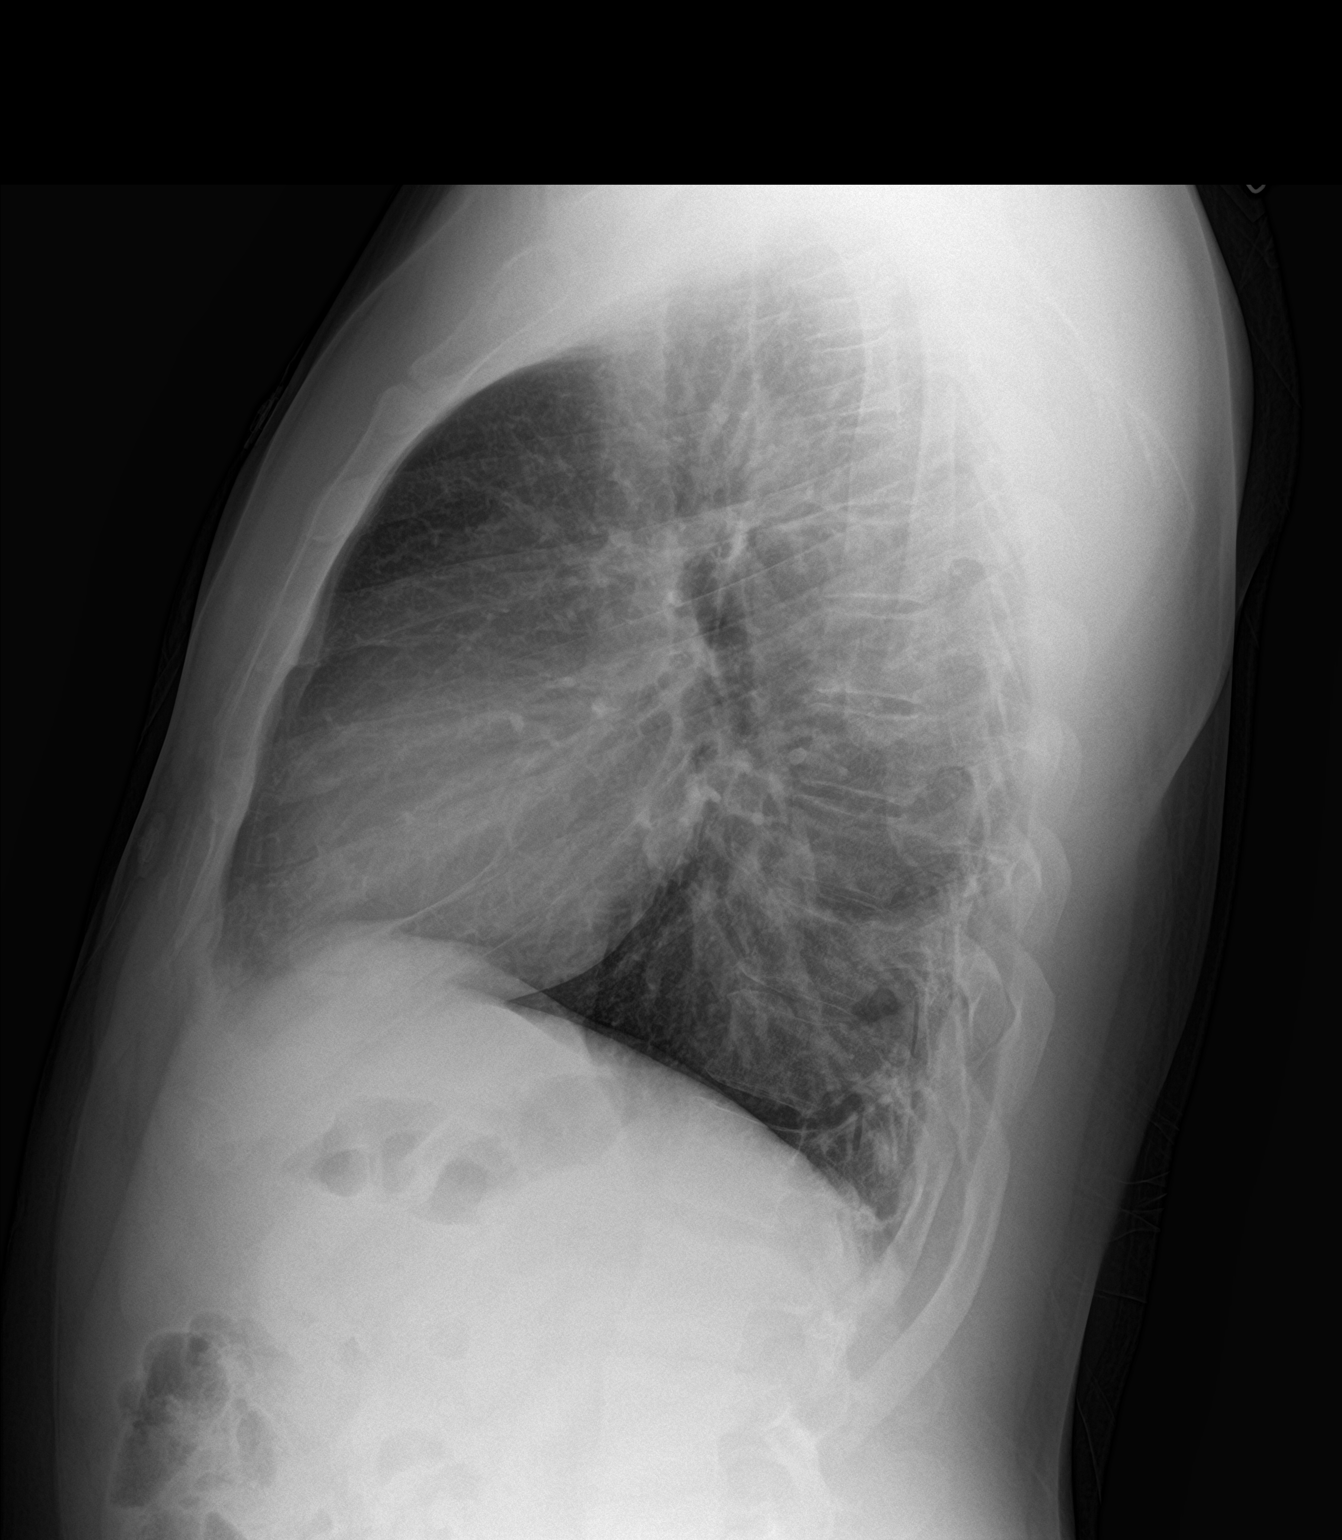

[2 of 2 positions shown; findings below may reference images not displayed]

FINDINGS: The cardiomediastinal silhouette is normal.

The lungs clear, with no focal consolidation or pulmonary edema.
There is no pleural effusion or pneumothorax.

There is dextroscoliosis centered at L1, unchanged. There is no
acute osseous abnormality.
IMPRESSION: No radiographic evidence of acute cardiopulmonary process.

## 2022-12-13 ENCOUNTER — Encounter (HOSPITAL_COMMUNITY): Payer: Self-pay

## 2022-12-13 ENCOUNTER — Emergency Department (HOSPITAL_COMMUNITY)
Admission: EM | Admit: 2022-12-13 | Discharge: 2022-12-14 | Disposition: A | Discharge status: Home / Self Care | Payer: BLUE CROSS/BLUE SHIELD | Attending: Emergency Medicine | Admitting: Emergency Medicine

## 2022-12-13 ENCOUNTER — Other Ambulatory Visit: Payer: Self-pay

## 2022-12-13 DIAGNOSIS — R531 Weakness: Secondary | ICD-10-CM | POA: Insufficient documentation

## 2022-12-13 DIAGNOSIS — Z20822 Contact with and (suspected) exposure to covid-19: Secondary | ICD-10-CM | POA: Diagnosis not present

## 2022-12-13 DIAGNOSIS — R112 Nausea with vomiting, unspecified: Secondary | ICD-10-CM | POA: Insufficient documentation

## 2022-12-13 DIAGNOSIS — R197 Diarrhea, unspecified: Secondary | ICD-10-CM | POA: Insufficient documentation

## 2022-12-13 DIAGNOSIS — B349 Viral infection, unspecified: Secondary | ICD-10-CM

## 2022-12-13 DIAGNOSIS — R059 Cough, unspecified: Secondary | ICD-10-CM | POA: Insufficient documentation

## 2022-12-13 LAB — COMPREHENSIVE METABOLIC PANEL
ALT: 31 U/L (ref 0–44)
AST: 26 U/L (ref 15–41)
Albumin: 3.8 g/dL (ref 3.5–5.0)
Alkaline Phosphatase: 71 U/L (ref 38–126)
Anion gap: 9 (ref 5–15)
BUN: 9 mg/dL (ref 6–20)
CO2: 22 mmol/L (ref 22–32)
Calcium: 9 mg/dL (ref 8.9–10.3)
Chloride: 107 mmol/L (ref 98–111)
Creatinine, Ser: 0.98 mg/dL (ref 0.61–1.24)
GFR, Estimated: >60 mL/min (ref >60)
Glucose, Bld: 75 mg/dL (ref 70–99)
Potassium: 3.7 mmol/L (ref 3.5–5.1)
Sodium: 138 mmol/L (ref 135–145)
Total Bilirubin: 0.4 mg/dL (ref <1.2)
Total Protein: 7.5 g/dL (ref 6.5–8.1)

## 2022-12-13 LAB — CBC
HCT: 46.1 % (ref 39.0–52.0)
Hemoglobin: 15.2 g/dL (ref 13.0–17.0)
MCH: 31.3 pg (ref 26.0–34.0)
MCHC: 33 g/dL (ref 30.0–36.0)
MCV: 94.9 fL (ref 80.0–100.0)
Platelets: 349 10*3/uL (ref 150–400)
RBC: 4.86 MIL/uL (ref 4.22–5.81)
RDW: 12.8 % (ref 11.5–15.5)
WBC: 8.6 10*3/uL (ref 4.0–10.5)
nRBC: 0 % (ref 0.0–0.2)

## 2022-12-13 LAB — RESP PANEL BY RT-PCR (RSV, FLU A&B, COVID)  RVPGX2
Influenza A by PCR: NEGATIVE
Influenza B by PCR: NEGATIVE
Resp Syncytial Virus by PCR: NEGATIVE
SARS Coronavirus 2 by RT PCR: NEGATIVE

## 2022-12-13 LAB — LIPASE, BLOOD: Lipase: 45 U/L (ref 11–51)

## 2022-12-13 NOTE — ED Triage Notes (Addendum)
Pt c/o generalized weakness, N/V/D, fevers , productive cough since Thursday; states daughter was sick last week with cold; denies pain; taking TheraFlu at home without relief

## 2022-12-14 ENCOUNTER — Emergency Department (HOSPITAL_COMMUNITY): Payer: BLUE CROSS/BLUE SHIELD

## 2022-12-14 LAB — URINALYSIS, ROUTINE W REFLEX MICROSCOPIC
Bilirubin Urine: NEGATIVE
Glucose, UA: NEGATIVE mg/dL
Hgb urine dipstick: NEGATIVE
Ketones, ur: NEGATIVE mg/dL
Leukocytes,Ua: NEGATIVE
Nitrite: NEGATIVE
Protein, ur: NEGATIVE mg/dL
Specific Gravity, Urine: 1.029 (ref 1.005–1.030)
pH: 5 (ref 5.0–8.0)

## 2022-12-14 MED ORDER — KETOROLAC TROMETHAMINE 15 MG/ML IJ SOLN
15.0000 mg | Freq: Once | INTRAMUSCULAR | Status: AC
  Administered 2022-12-14: 15 mg via INTRAVENOUS
  Filled 2022-12-14: qty 1

## 2022-12-14 MED ORDER — LACTATED RINGERS IV BOLUS
1000.0000 mL | Freq: Once | INTRAVENOUS | Status: AC
  Administered 2022-12-14: 1000 mL via INTRAVENOUS

## 2022-12-14 MED ORDER — ONDANSETRON 4 MG PO TBDP: 4.0000 mg | ORAL_TABLET | Freq: Three times a day (TID) | ORAL | 0 refills | Status: DC | PRN

## 2022-12-14 MED ORDER — ONDANSETRON HCL 4 MG/2ML IJ SOLN
4.0000 mg | Freq: Once | INTRAMUSCULAR | Status: AC
  Administered 2022-12-14: 4 mg via INTRAVENOUS
  Filled 2022-12-14: qty 2

## 2022-12-14 NOTE — Discharge Instructions (Signed)
Your testing is reassuring.  COVID and flu testing is negative.  Start with a clear liquid diet and advance slowly as tolerated.  Return to the ED with worsening pain, not able to eat or drink, difficulty breathing, chest pain, high fevers or other concerns.

## 2022-12-14 NOTE — ED Provider Notes (Signed)
[place] EMERGENCY DEPARTMENT AT [facility] Provider Note   CSN: [id number] Arrival date & time: 12/13/22  1742     History  No chief complaint on file.   Robert Dillon is a 37 y.o. male.  Patient presents with a 4-day history of generalized weakness, nausea, vomiting, diarrhea and cough.  States he has been vomiting about 2 times a day and having diarrhea about 2 times a day as well for the past 4 days but he is still able to eat and drink.  Has had a productive cough and clear and yellow mucus.  He has felt hot but not checked his temperature.  Having intermittent dizziness and lightheadedness with generalized weakness and chills.  No pain with urination or blood in the urine.  No headache, sore throat, chest pain, shortness of breath, abdominal pain, focal weakness, numbness or tingling.  Feels lightheaded but not room spinning dizziness.  History of asthma but no other medical problems.  Believes his breathing is the same as usual. No significant abdominal pain. Daughter has been sick with similar symptoms but she has improved.  He is concerned he could have the flu.  Has been taking TheraFlu at home without relief.  The history is provided by the patient.       Home Medications Prior to Admission medications   Medication Sig Start Date End Date Taking? Authorizing Provider  ibuprofen (ADVIL) 800 MG tablet Take 1 tablet (800 mg total) by mouth 3 (three) times daily. 11/12/19   Wieters, Hallie C, PA-C  methocarbamol (ROBAXIN) 500 MG tablet Take 1 tablet (500 mg total) by mouth every 8 (eight) hours as needed for muscle spasms. 09/15/20   Petrucelli, Samantha R, PA-C  naproxen (NAPROSYN) 500 MG tablet Take 1 tablet (500 mg total) by mouth 2 (two) times daily as needed for moderate pain. 09/15/20   Petrucelli, Samantha R, PA-C  ondansetron (ZOFRAN ODT) 4 MG disintegrating tablet Take 1 tablet (4 mg total) by mouth every 8 (eight) hours as needed for nausea or vomiting.  01/22/19   Harlene Salts A, PA-C      Allergies    Penicillins    Review of Systems   Review of Systems  Constitutional:  Positive for chills and fatigue. Negative for activity change, appetite change and fever.  HENT:  Negative for congestion, rhinorrhea and sore throat.   Respiratory:  Negative for cough, chest tightness and shortness of breath.   Cardiovascular:  Negative for chest pain.  Gastrointestinal:  Positive for diarrhea, nausea and vomiting.  Genitourinary:  Negative for dysuria and hematuria.  Musculoskeletal:  Positive for arthralgias and myalgias.  Skin:  Negative for rash.  Neurological:  Positive for weakness. Negative for dizziness and headaches.   all other systems are negative except as noted in the HPI and PMH.    Physical Exam Updated Vital Signs BP (!) 142/92   Pulse 76   Temp 98.6 F (37 C)   Resp 18   SpO2 96%  Physical Exam Vitals and nursing note reviewed.  Constitutional:      General: He is not in acute distress.    Appearance: He is well-developed.  HENT:     Head: Normocephalic and atraumatic.     Right Ear: Tympanic membrane normal.     Left Ear: Tympanic membrane normal.     Mouth/Throat:     Mouth: Mucous membranes are moist.     Pharynx: No oropharyngeal exudate or posterior oropharyngeal erythema.  Eyes:  Conjunctiva/sclera: Conjunctivae normal.     Pupils: Pupils are equal, round, and reactive to light.  Neck:     Comments: No meningismus. Cardiovascular:     Rate and Rhythm: Normal rate and regular rhythm.     Heart sounds: Normal heart sounds. No murmur heard. Pulmonary:     Effort: Pulmonary effort is normal. No respiratory distress.     Breath sounds: Normal breath sounds.  Abdominal:     Palpations: Abdomen is soft.     Tenderness: There is no abdominal tenderness. There is no guarding or rebound.  Musculoskeletal:        General: No tenderness. Normal range of motion.     Cervical back: Normal range of motion and  neck supple.  Skin:    General: Skin is warm.  Neurological:     Mental Status: He is alert and oriented to person, place, and time.     Cranial Nerves: No cranial nerve deficit.     Motor: No abnormal muscle tone.     Coordination: Coordination normal.     Comments: No ataxia on finger to nose bilaterally. No pronator drift. 5/5 strength throughout. CN 2-12 intact.Equal grip strength. Sensation intact.   Psychiatric:        Behavior: Behavior normal.     ED Results / Procedures / Treatments   Labs (all labs ordered are listed, but only abnormal results are displayed) Labs Reviewed  RESP PANEL BY RT-PCR (RSV, FLU A&B, COVID)  RVPGX2  LIPASE, BLOOD  COMPREHENSIVE METABOLIC PANEL  CBC  URINALYSIS, ROUTINE W REFLEX MICROSCOPIC    EKG None  Radiology DG Chest 2 View  Result Date: 12/14/2022 CLINICAL DATA:  Cough and fever EXAM: CHEST - 2 VIEW COMPARISON:  09/15/2020 FINDINGS: The heart size and mediastinal contours are within normal limits. Both lungs are clear. The visualized skeletal structures are unremarkable. IMPRESSION: No active cardiopulmonary disease. Electronically Signed   By: Minerva Fester M.D.   On: 12/14/2022 03:39    Procedures Procedures    Medications Ordered in ED Medications  lactated ringers bolus 1,000 mL (has no administration in time range)  ondansetron (ZOFRAN) injection 4 mg (has no administration in time range)  ketorolac (TORADOL) 15 MG/ML injection 15 mg (has no administration in time range)    ED Course/ Medical Decision Making/ A&P                                 Medical Decision Making Amount and/or Complexity of Data Reviewed Labs: ordered. Decision-making details documented in ED Course. Radiology: ordered and independent interpretation performed. Decision-making details documented in ED Course. ECG/medicine tests: ordered and independent interpretation performed. Decision-making details documented in ED Course.  Risk Prescription  drug management.   4 days of nausea, vomiting, diarrhea, weakness and cough with chills.  Stable vitals on arrival.  No respiratory distress.  Clear lungs.  Abdomen soft without peritoneal signs.  Labs obtained in triage are reassuring.  Urinalysis is negative.  No significant leukocytosis.  LFTs and lipase are normal.  COVID and flu test are negative.  Will obtain chest x-ray given his productive cough and chills  Patient well-appearing.  Abdomen soft and nontender.  Chest x-ray is negative for pneumonia or pneumothorax.  Results reviewed interpreted by me.  He is tolerating p.o.  Suspect likely viral syndrome.  Low suspicion for acute intra-abdominal pathology.  COVID and flu testing are negative.  Discussed this  could be another kind of virus.  Recommend starting clear liquid diet and advance slowly as tolerated.  Return to the ED with difficulty breathing, worsening abdominal pain, fever, not able to keep anything down, chest pain or other concerns.       Final Clinical Impression(s) / ED Diagnoses Final diagnoses:  None    Rx / DC Orders ED Discharge Orders     None         Dawsyn Ramsaran, Jeannett Senior, MD 12/14/22 [phone]

## 2023-05-31 ENCOUNTER — Emergency Department (HOSPITAL_COMMUNITY)
Admission: EM | Admit: 2023-05-31 | Discharge: 2023-05-31 | Disposition: A | Discharge status: Home / Self Care | Payer: Self-pay | Attending: Emergency Medicine | Admitting: Emergency Medicine

## 2023-05-31 ENCOUNTER — Encounter (HOSPITAL_COMMUNITY): Payer: Self-pay | Admitting: Pharmacy Technician

## 2023-05-31 ENCOUNTER — Other Ambulatory Visit: Payer: Self-pay

## 2023-05-31 ENCOUNTER — Emergency Department (HOSPITAL_COMMUNITY): Payer: Self-pay

## 2023-05-31 DIAGNOSIS — R197 Diarrhea, unspecified: Secondary | ICD-10-CM

## 2023-05-31 DIAGNOSIS — B349 Viral infection, unspecified: Secondary | ICD-10-CM | POA: Insufficient documentation

## 2023-05-31 DIAGNOSIS — E860 Dehydration: Secondary | ICD-10-CM | POA: Insufficient documentation

## 2023-05-31 DIAGNOSIS — F17200 Nicotine dependence, unspecified, uncomplicated: Secondary | ICD-10-CM | POA: Insufficient documentation

## 2023-05-31 LAB — I-STAT CHEM 8, ED
BUN: 7 mg/dL (ref 6–20)
Calcium, Ion: 1.09 mmol/L — ABNORMAL LOW (ref 1.15–1.40)
Chloride: 101 mmol/L (ref 98–111)
Creatinine, Ser: 1 mg/dL (ref 0.61–1.24)
Glucose, Bld: 95 mg/dL (ref 70–99)
HCT: 53 % — ABNORMAL HIGH (ref 39.0–52.0)
Hemoglobin: 18 g/dL — ABNORMAL HIGH (ref 13.0–17.0)
Potassium: 3.5 mmol/L (ref 3.5–5.1)
Sodium: 137 mmol/L (ref 135–145)
TCO2: 23 mmol/L (ref 22–32)

## 2023-05-31 LAB — RESP PANEL BY RT-PCR (RSV, FLU A&B, COVID)  RVPGX2
Influenza A by PCR: NEGATIVE
Influenza B by PCR: NEGATIVE
Resp Syncytial Virus by PCR: NEGATIVE
SARS Coronavirus 2 by RT PCR: NEGATIVE

## 2023-05-31 MED ORDER — ONDANSETRON 4 MG PO TBDP: 4.0000 mg | ORAL_TABLET | Freq: Three times a day (TID) | ORAL | 0 refills | Status: AC | PRN

## 2023-05-31 MED ORDER — SODIUM CHLORIDE 0.9 % IV BOLUS
1000.0000 mL | Freq: Once | INTRAVENOUS | Status: AC
  Administered 2023-05-31: 1000 mL via INTRAVENOUS

## 2023-05-31 NOTE — ED Notes (Signed)
 Patient transported to X-ray

## 2023-05-31 NOTE — Discharge Instructions (Addendum)
 As discussed, your evaluation today has been largely reassuring.  But, it is important that you monitor your condition carefully, and do not hesitate to return to the ED if you develop new, or concerning changes in your condition. ? ?Otherwise, please follow-up with your physician for appropriate ongoing care. ? ?

## 2023-05-31 NOTE — ED Triage Notes (Signed)
 Pt here with decreased PO intake, diarrhea, cough, chills since yesterday morning.

## 2023-05-31 NOTE — ED Provider Notes (Signed)
 [place] EMERGENCY DEPARTMENT AT [facility] Provider Note   CSN: [id number] Arrival date & time: 05/31/23  4098     History  Chief Complaint  Patient presents with   Cough   Diarrhea    Robert Dillon is a 38 y.o. male.  HPI Patient presents with fatigue, weakness, diarrhea, anorexia.  Patient was well until 4 PM yesterday, 16 hours ago.  Relatively suddenly he began feeling weak, has had persistent symptoms.  Throughout this period. Patient states that he is generally well does smoke.  No known sick contacts or obvious precipitants.    Home Medications Prior to Admission medications   Medication Sig Start Date End Date Taking? Authorizing Provider  ondansetron  (ZOFRAN -ODT) 4 MG disintegrating tablet Take 1 tablet (4 mg total) by mouth every 8 (eight) hours as needed for nausea or vomiting. 05/31/23  Yes Dorenda Gandy, MD      Allergies    Penicillins    Review of Systems   Review of Systems  Physical Exam Updated Vital Signs BP (!) 137/91 (BP Location: Right Arm)   Pulse 95   Temp 98.9 F (37.2 C)   Resp 16   SpO2 94%  Physical Exam Vitals and nursing note reviewed.  Constitutional:      General: He is not in acute distress.    Appearance: He is well-developed.  HENT:     Head: Normocephalic and atraumatic.  Eyes:     Conjunctiva/sclera: Conjunctivae normal.  Cardiovascular:     Rate and Rhythm: Normal rate and regular rhythm.  Pulmonary:     Effort: Pulmonary effort is normal. No respiratory distress.     Breath sounds: No stridor.  Abdominal:     General: There is no distension.     Palpations: There is no mass.     Tenderness: There is no abdominal tenderness. There is no guarding or rebound.  Skin:    General: Skin is warm and dry.  Neurological:     Mental Status: He is alert and oriented to person, place, and time.     ED Results / Procedures / Treatments   Labs (all labs ordered are listed, but only abnormal results are  displayed) Labs Reviewed  I-STAT CHEM 8, ED - Abnormal; Notable for the following components:      Result Value   Calcium, Ion 1.09 (*)    Hemoglobin 18.0 (*)    HCT 53.0 (*)    All other components within normal limits  RESP PANEL BY RT-PCR (RSV, FLU A&B, COVID)  RVPGX2    EKG None  Radiology DG Chest 2 View Result Date: 05/31/2023 CLINICAL DATA:  cough and chest pain EXAM: CHEST - 2 VIEW COMPARISON:  December 14, 2022 FINDINGS: No focal airspace consolidation, pleural effusion, or pneumothorax. No cardiomegaly. No acute fracture or destructive lesion. IMPRESSION: No acute cardiopulmonary abnormality. Electronically Signed   By: Rance Burrows M.D.   On: 05/31/2023 08:13    Procedures Procedures    Medications Ordered in ED Medications  sodium chloride  0.9 % bolus 1,000 mL (1,000 mLs Intravenous New Bag/Given 05/31/23 0827)    ED Course/ Medical Decision Making/ A&P                                  Medical Decision Making Adult male, smoker, presents with 1 day of weakness, fatigue, concern for dehydration. Patient is awake and alert, afebrile, has mild  relative tachycardia, suggesting mild dehydration, concern for pneumonia, viral process, labs x-ray fluids started.  Amount and/or Complexity of Data Reviewed Labs: ordered. Decision-making details documented in ED Course. Radiology: ordered and independent interpretation performed. Decision-making details documented in ED Course.   10:20 AM Patient in no distress on repeat exam.  COVID-negative flu negative, labs within normal limits.  Mild hemoconcentration, consistent with mild dehydration, also noted on mild tachycardia.  Without distress, evidence for bacteremia, sepsis, patient will continue fluid resuscitation at home, was discharged in stable condition.  No evidence for bacteremia, sepsis.        Final Clinical Impression(s) / ED Diagnoses Final diagnoses:  Viral syndrome  Dehydration  Diarrhea,  unspecified type    Rx / DC Orders ED Discharge Orders          Ordered    ondansetron  (ZOFRAN -ODT) 4 MG disintegrating tablet  Every 8 hours PRN        05/31/23 1020              Dorenda Gandy, MD 05/31/23 1020
